# Patient Record
Sex: Female | Born: 1950 | Race: White | Hispanic: No | Smoking: Former smoker
Health system: Southern US, Community
[De-identification: ages and names within clinical notes are randomized; demographics above are authoritative.]

## PROBLEM LIST (undated history)

## (undated) DIAGNOSIS — G4730 Sleep apnea, unspecified: Secondary | ICD-10-CM

## (undated) DIAGNOSIS — K635 Polyp of colon: Secondary | ICD-10-CM

## (undated) DIAGNOSIS — E785 Hyperlipidemia, unspecified: Secondary | ICD-10-CM

## (undated) DIAGNOSIS — H353132 Nonexudative age-related macular degeneration, bilateral, intermediate dry stage: Secondary | ICD-10-CM

## (undated) DIAGNOSIS — Z8719 Personal history of other diseases of the digestive system: Secondary | ICD-10-CM

## (undated) DIAGNOSIS — J45909 Unspecified asthma, uncomplicated: Secondary | ICD-10-CM

## (undated) DIAGNOSIS — K5792 Diverticulitis of intestine, part unspecified, without perforation or abscess without bleeding: Secondary | ICD-10-CM

## (undated) DIAGNOSIS — H524 Presbyopia: Secondary | ICD-10-CM

## (undated) DIAGNOSIS — R911 Solitary pulmonary nodule: Secondary | ICD-10-CM

## (undated) DIAGNOSIS — H43813 Vitreous degeneration, bilateral: Secondary | ICD-10-CM

## (undated) DIAGNOSIS — M1990 Unspecified osteoarthritis, unspecified site: Secondary | ICD-10-CM

## (undated) DIAGNOSIS — D490 Neoplasm of unspecified behavior of digestive system: Secondary | ICD-10-CM

## (undated) DIAGNOSIS — H52209 Unspecified astigmatism, unspecified eye: Secondary | ICD-10-CM

## (undated) DIAGNOSIS — D3131 Benign neoplasm of right choroid: Secondary | ICD-10-CM

## (undated) DIAGNOSIS — K649 Unspecified hemorrhoids: Secondary | ICD-10-CM

## (undated) DIAGNOSIS — Z87442 Personal history of urinary calculi: Secondary | ICD-10-CM

## (undated) DIAGNOSIS — H26492 Other secondary cataract, left eye: Secondary | ICD-10-CM

## (undated) DIAGNOSIS — R590 Localized enlarged lymph nodes: Secondary | ICD-10-CM

## (undated) DIAGNOSIS — C189 Malignant neoplasm of colon, unspecified: Secondary | ICD-10-CM

## (undated) DIAGNOSIS — Z961 Presence of intraocular lens: Secondary | ICD-10-CM

## (undated) HISTORY — DX: Polyp of colon: K63.5

## (undated) HISTORY — DX: Malignant neoplasm of colon, unspecified: C18.9

## (undated) HISTORY — DX: Unspecified osteoarthritis, unspecified site: M19.90

## (undated) HISTORY — PX: BLADDER SUSPENSION: SHX72

## (undated) HISTORY — PX: ABDOMINAL HYSTERECTOMY: SHX81

## (undated) HISTORY — PX: CATARACT EXTRACTION, BILATERAL: SHX1313

## (undated) HISTORY — DX: Presence of intraocular lens: Z96.1

## (undated) HISTORY — DX: Nonexudative age-related macular degeneration, bilateral, intermediate dry stage: H35.3132

## (undated) HISTORY — DX: Sleep apnea, unspecified: G47.30

## (undated) HISTORY — DX: Diverticulitis of intestine, part unspecified, without perforation or abscess without bleeding: K57.92

## (undated) HISTORY — DX: Unspecified asthma, uncomplicated: J45.909

## (undated) HISTORY — DX: Unspecified astigmatism, unspecified eye: H52.209

## (undated) HISTORY — DX: Benign neoplasm of right choroid: D31.31

## (undated) HISTORY — PX: TUBAL LIGATION: SHX77

## (undated) HISTORY — DX: Neoplasm of unspecified behavior of digestive system: D49.0

## (undated) HISTORY — PX: ESOPHAGOGASTRODUODENOSCOPY: SHX1529

## (undated) HISTORY — DX: Unspecified hemorrhoids: K64.9

## (undated) HISTORY — PX: CHOLECYSTECTOMY: SHX55

## (undated) HISTORY — DX: Presbyopia: H52.4

## (undated) HISTORY — DX: Vitreous degeneration, bilateral: H43.813

## (undated) HISTORY — DX: Other secondary cataract, left eye: H26.492

## (undated) HISTORY — DX: Hyperlipidemia, unspecified: E78.5

---

## 2013-10-15 ENCOUNTER — Other Ambulatory Visit: Payer: Self-pay | Admitting: Orthopedic Surgery

## 2013-10-15 DIAGNOSIS — M25562 Pain in left knee: Secondary | ICD-10-CM

## 2013-10-18 ENCOUNTER — Ambulatory Visit
Admission: RE | Admit: 2013-10-18 | Discharge: 2013-10-18 | Disposition: A | Discharge status: Home / Self Care | Payer: Commercial Managed Care - PPO | Source: Ambulatory Visit | Attending: Orthopedic Surgery | Admitting: Orthopedic Surgery

## 2013-10-18 DIAGNOSIS — M25562 Pain in left knee: Secondary | ICD-10-CM

## 2013-10-21 ENCOUNTER — Ambulatory Visit (HOSPITAL_BASED_OUTPATIENT_CLINIC_OR_DEPARTMENT_OTHER)
Admission: RE | Admit: 2013-10-21 | Discharge: 2013-10-21 | Disposition: A | Discharge status: Home / Self Care | Payer: Commercial Managed Care - PPO | Source: Ambulatory Visit | Attending: Orthopedic Surgery | Admitting: Orthopedic Surgery

## 2013-10-21 ENCOUNTER — Other Ambulatory Visit (HOSPITAL_BASED_OUTPATIENT_CLINIC_OR_DEPARTMENT_OTHER): Payer: Self-pay | Admitting: Orthopedic Surgery

## 2013-10-21 DIAGNOSIS — M5137 Other intervertebral disc degeneration, lumbosacral region: Secondary | ICD-10-CM | POA: Insufficient documentation

## 2013-10-21 DIAGNOSIS — M48061 Spinal stenosis, lumbar region without neurogenic claudication: Secondary | ICD-10-CM | POA: Insufficient documentation

## 2013-10-21 DIAGNOSIS — M545 Low back pain, unspecified: Secondary | ICD-10-CM | POA: Insufficient documentation

## 2013-10-21 DIAGNOSIS — M47817 Spondylosis without myelopathy or radiculopathy, lumbosacral region: Secondary | ICD-10-CM | POA: Insufficient documentation

## 2013-10-21 DIAGNOSIS — M79609 Pain in unspecified limb: Secondary | ICD-10-CM | POA: Insufficient documentation

## 2013-10-21 DIAGNOSIS — M51379 Other intervertebral disc degeneration, lumbosacral region without mention of lumbar back pain or lower extremity pain: Secondary | ICD-10-CM | POA: Insufficient documentation

## 2017-10-26 ENCOUNTER — Ambulatory Visit (INDEPENDENT_AMBULATORY_CARE_PROVIDER_SITE_OTHER): Payer: Medicare HMO | Admitting: Emergency Medicine

## 2017-10-26 ENCOUNTER — Encounter: Payer: Self-pay | Admitting: *Deleted

## 2017-10-26 ENCOUNTER — Ambulatory Visit (INDEPENDENT_AMBULATORY_CARE_PROVIDER_SITE_OTHER)
Admission: RE | Admit: 2017-10-26 | Discharge: 2017-10-26 | Disposition: A | Discharge status: Home / Self Care | Payer: Medicare HMO | Source: Ambulatory Visit | Attending: Emergency Medicine | Admitting: Emergency Medicine

## 2017-10-26 ENCOUNTER — Other Ambulatory Visit: Payer: Self-pay | Admitting: Emergency Medicine

## 2017-10-26 ENCOUNTER — Ambulatory Visit: Payer: Medicare HMO | Admitting: Emergency Medicine

## 2017-10-26 VITALS — BP 120/96 | HR 104 | Ht 62.0 in | Wt 241.0 lb

## 2017-10-26 DIAGNOSIS — G4733 Obstructive sleep apnea (adult) (pediatric): Secondary | ICD-10-CM

## 2017-10-26 DIAGNOSIS — R0600 Dyspnea, unspecified: Secondary | ICD-10-CM

## 2017-10-26 DIAGNOSIS — C189 Malignant neoplasm of colon, unspecified: Secondary | ICD-10-CM | POA: Diagnosis not present

## 2017-10-26 DIAGNOSIS — R918 Other nonspecific abnormal finding of lung field: Secondary | ICD-10-CM

## 2017-10-26 DIAGNOSIS — J45909 Unspecified asthma, uncomplicated: Secondary | ICD-10-CM | POA: Diagnosis not present

## 2017-10-26 DIAGNOSIS — J309 Allergic rhinitis, unspecified: Secondary | ICD-10-CM | POA: Insufficient documentation

## 2017-10-26 DIAGNOSIS — J301 Allergic rhinitis due to pollen: Secondary | ICD-10-CM

## 2017-10-26 DIAGNOSIS — J449 Chronic obstructive pulmonary disease, unspecified: Secondary | ICD-10-CM | POA: Insufficient documentation

## 2017-10-26 DIAGNOSIS — R911 Solitary pulmonary nodule: Secondary | ICD-10-CM

## 2017-10-26 LAB — PULMONARY FUNCTION TEST
DL/VA % PRED: 99 %
DL/VA: 4.67 ml/min/mmHg/L
DLCO COR % PRED: 76 %
DLCO UNC % PRED: 81 %
DLCO UNC: 18.76 ml/min/mmHg
DLCO cor: 17.63 ml/min/mmHg
FEF 25-75 POST: 1.73 L/s
FEF 25-75 PRE: 1.34 L/s
FEF2575-%CHANGE-POST: 29 %
FEF2575-%PRED-POST: 91 %
FEF2575-%Pred-Pre: 70 %
FEV1-%CHANGE-POST: 7 %
FEV1-%PRED-POST: 76 %
FEV1-%Pred-Pre: 71 %
FEV1-PRE: 1.57 L
FEV1-Post: 1.68 L
FEV1FVC-%Change-Post: 7 %
FEV1FVC-%PRED-PRE: 102 %
FEV6-%Change-Post: 0 %
FEV6-%Pred-Post: 72 %
FEV6-%Pred-Pre: 72 %
FEV6-Post: 2.01 L
FEV6-Pre: 2.03 L
FEV6FVC-%Pred-Post: 104 %
FEV6FVC-%Pred-Pre: 104 %
FVC-%Change-Post: 0 %
FVC-%PRED-PRE: 69 %
FVC-%Pred-Post: 68 %
FVC-POST: 2.01 L
FVC-PRE: 2.03 L
POST FEV1/FVC RATIO: 84 %
PRE FEV1/FVC RATIO: 78 %
PRE FEV6/FVC RATIO: 100 %
Post FEV6/FVC ratio: 100 %
RV % PRED: 93 %
RV: 1.97 L
TLC % pred: 84 %
TLC: 4.12 L

## 2017-10-26 NOTE — Assessment & Plan Note (Signed)
Stage IIIb (pT3, pN1, Mx), status post sigmoid colectomy 05/27/16. Status post adjuvant chemotherapy with oxaliplatin and Xeloda 10/24/16 to 01/29/17

## 2017-10-26 NOTE — Assessment & Plan Note (Signed)
Severity unclear.  It does appear to be worse when she has active allergies.  She is currently using Dulera but only as needed.  Certainly we can change her to albuterol to use as needed.  We will perform full pulmonary function testing to quantify her degree of obstruction, plan bronchodilator therapy accordingly.

## 2017-10-26 NOTE — Assessment & Plan Note (Signed)
With associated hypermetabolic mediastinal lymphadenopathy.  Given the lymph nodes which would potentially upstage the patient were this to be primary lung cancer, and her history of colon cancer, I believe that a tissue diagnosis would be important before potentially committing her to primary resection.   We discussed the potential modalities for tissue diagnosis including nodule biopsy (either by navigational bronchoscopy or transthoracic needle biopsy), lymph node biopsies via endobronchial ultrasound.  I recommended EBUS since she needs staging and nodal biopsies could give a unifying diagnosis.  If the nodes are negative on EBUS, and if we are unable to get tissue from the lingular nodule then it would be reasonable to pursue transthoracic needle biopsy to determine whether this is primary lung cancer versus metastatic colon cancer.  If it were primary lung cancer and nodes were negative then she would potentially be a candidate for primary resection.  We can then discuss this with Dr. Freda Munro.

## 2017-10-26 NOTE — Progress Notes (Signed)
Subjective:    Patient ID: Natasha Peters, female    DOB: 10/21/1951, 66 y.o.   MRN: [id number]  HPI 66 year old former smoker (40 pack years) with a history of sleep apnea on CPAP, asthma that was made 3-4 years ago. She has Dulera that she uses prn. Uses zyrtec prn as well.   She has adenocarcinoma of the sigmoid colon status post sigmoid colectomy 09/2016, adjuvant chemotherapy completed 01/2017, managed by Dr Rudean Hitt.  1 out of 13 nodes positive for involvement.  Also with history of CLL.  She was noted to have a peripheral lingular pulmonary nodule on CT chest 02/2017.  Enlarged and more solid on repeat CT 09/24/17 scan that I have reviewed from 09/07/17.  This prompted a PET scan that was done on 09/24/17 that shows no abdominal hypermetabolism but confirmed hyper metabolic 1.7 cm solid lingular pulmonary nodule, enlarged and hypermetabolic subcarinal and right lower paratracheal mediastinal lymphadenopathy.  She saw Dr. Freda Munro with thoracic surgery, and is referred now for planning tissue diagnosis.  Review of Systems  Constitutional: Negative for fever and unexpected weight change.  HENT: Negative for congestion, dental problem, ear pain, nosebleeds, postnasal drip, rhinorrhea, sinus pressure, sneezing, sore throat and trouble swallowing.   Eyes: Negative for redness and itching.  Respiratory: Negative for cough, chest tightness, shortness of breath and wheezing.   Cardiovascular: Negative for palpitations and leg swelling.  Gastrointestinal: Negative for nausea and vomiting.  Genitourinary: Negative for dysuria.  Musculoskeletal: Negative for joint swelling.  Skin: Negative for rash.  Neurological: Negative for headaches.  Hematological: Does not bruise/bleed easily.  Psychiatric/Behavioral: Negative for dysphoric mood. The patient is not nervous/anxious.     Past Medical History:  Diagnosis Date  . Arthritis   . Asthma   . Astigmatism with presbyopia   . Benign neoplasm of  right choroid   . Choroidal nevus, right   . Colon cancer ([place])   . Colon neoplasm   . Colon polyp   . Diverticulitis   . Hemorrhoids   . Hyperlipidemia   . 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 Posterior capsular opacification non visually significant of left eye   . Pseudophakia of both eyes   . PVD (posterior vitreous detachment), both eyes   . Sleep apnea      Family History  Problem Relation Age of Onset  . Arthritis Father   . Hypertension Father   . Macular degeneration Father   . Macular degeneration Sister   . Diabetes Sister   . Cataracts Sister   . Hypertension Brother   . Diabetes Brother   . Macular degeneration Maternal Aunt      Social History   Socioeconomic History  . Marital status: Married    Spouse name: Not on file  . Number of children: Not on file  . Years of education: Not on file  . Highest education level: Not on file  Social Needs  . Financial resource strain: Not on file  . Food insecurity - worry: Not on file  . Food insecurity - inability: Not on file  . Transportation needs - medical: Not on file  . Transportation needs - non-medical: Not on file  Occupational History  . Not on file  Tobacco Use  . Smoking status: Former Smoker    Packs/day: 1.00    Years: 40.00    Pack years: 40.00    Types: Cigarettes    Last attempt to quit: 11/14/2003  Years since quitting: 13.9  . Smokeless tobacco: Never Used  Substance and Sexual Activity  . Alcohol use: No    Frequency: Never  . Drug use: No  . Sexual activity: Not on file  Other Topics Concern  . Not on file  Social History Narrative  . Not on file     Allergies  Allergen Reactions  . Actonel [Risedronate Sodium] Swelling  . Drixoral Allergy Sinus [Dexbromphen-Pse-Apap Er] Swelling  . Nsaids Diarrhea     Outpatient Medications Prior to Visit  Medication Sig Dispense Refill  . Acetaminophen (TYLENOL ARTHRITIS PAIN PO) Take 1 tablet by  mouth as needed.    Marland Kitchen aspirin EC 81 MG tablet Take 81 mg by mouth daily.    . Calcium Carbonate-Vit D-Min (CALCIUM 1200 PO) Take 1 tablet by mouth daily.    . cetirizine (ZYRTEC) 10 MG tablet Take 10 mg by mouth daily.    . Cholecalciferol (VITAMIN D3) 1000 units CAPS Take 1 capsule by mouth daily.    . Flaxseed, Linseed, (FLAXSEED OIL) 1000 MG CAPS Take 1 capsule by mouth daily.    Javier Docker Oil 1000 MG CAPS Take 1 capsule by mouth daily.    . mometasone-formoterol (DULERA) 200-5 MCG/ACT AERO Inhale 2 puffs into the lungs 2 (two) times daily.    . Multiple Vitamin (MULTIVITAMIN WITH MINERALS) TABS tablet Take 1 tablet by mouth daily.    . Multiple Vitamins-Minerals (PRESERVISION AREDS 2 PO) Take 1 tablet by mouth daily.    . pravastatin (PRAVACHOL) 80 MG tablet Take 80 mg by mouth daily.    Marland Kitchen triamterene-hydrochlorothiazide (MAXZIDE) 75-50 MG tablet Take 1 tablet by mouth daily as needed (for swelling).    . hydrochlorothiazide (HYDRODIURIL) 25 MG tablet Take 25 mg by mouth daily.    . pravastatin (PRAVACHOL) 20 MG tablet Take 20 mg by mouth daily.     No facility-administered medications prior to visit.         Objective:   Physical Exam Vitals:   10/26/17 1125 10/26/17 1126  BP:  (!) 120/96  Pulse:  (!) 104  SpO2:  96%  Weight: 241 lb (109.3 kg)   Height: 5\' 2"  (1.575 m)    Gen: Pleasant, well-nourished, in no distress,  normal affect  ENT: No lesions,  mouth clear,  oropharynx clear, no postnasal drip  Neck: No JVD, no TMG, no carotid bruits  Lungs: No use of accessory muscles, no dullness to percussion, clear without rales or rhonchi  Cardiovascular: RRR, heart sounds normal, no murmur or gallops, no peripheral edema  Musculoskeletal: No deformities, no cyanosis or clubbing  Neuro: alert, non focal  Skin: Warm, no lesions or rashes     Assessment & Plan:  Pulmonary nodule, left With associated hypermetabolic mediastinal lymphadenopathy.  Given the lymph nodes which  would potentially upstage the patient were this to be primary lung cancer, and her history of colon cancer, I believe that a tissue diagnosis would be important before potentially committing her to primary resection.   We discussed the potential modalities for tissue diagnosis including nodule biopsy (either by navigational bronchoscopy or transthoracic needle biopsy), lymph node biopsies via endobronchial ultrasound.  I recommended EBUS since she needs staging and nodal biopsies could give a unifying diagnosis.  If the nodes are negative on EBUS, and if we are unable to get tissue from the lingular nodule then it would be reasonable to pursue transthoracic needle biopsy to determine whether this is primary lung cancer versus metastatic  colon cancer.  If it were primary lung cancer and nodes were negative then she would potentially be a candidate for primary resection.  We can then discuss this with Dr. Freda Munro.   Asthma Severity unclear.  It does appear to be worse when she has active allergies.  She is currently using Dulera but only as needed.  Certainly we can change her to albuterol to use as needed.  We will perform full pulmonary function testing to quantify her degree of obstruction, plan bronchodilator therapy accordingly.  Allergic rhinitis Currently uses Zyrtec as needed  Obstructive sleep apnea Good compliance with CPAP, good clinical benefit.  Baltazar Apo, MD, PhD 10/26/2017, 1:39 PM [facility] [phone] or if no answer [phone]

## 2017-10-26 NOTE — Assessment & Plan Note (Signed)
Good compliance with CPAP, good clinical benefit.

## 2017-10-26 NOTE — H&P (View-Only) (Signed)
Subjective:    Patient ID: Natasha Peters, female    DOB: 1951-03-14, 66 y.o.   MRN: [id number]  HPI 66 year old former smoker (40 pack years) with a history of sleep apnea on CPAP, asthma that was made 3-4 years ago. She has Dulera that she uses prn. Uses zyrtec prn as well.   She has adenocarcinoma of the sigmoid colon status post sigmoid colectomy 09/2016, adjuvant chemotherapy completed 01/2017, managed by Dr Rudean Hitt.  1 out of 13 nodes positive for involvement.  Also with history of CLL.  She was noted to have a peripheral lingular pulmonary nodule on CT chest 02/2017.  Enlarged and more solid on repeat CT 09/24/17 scan that I have reviewed from 09/07/17.  This prompted a PET scan that was done on 09/24/17 that shows no abdominal hypermetabolism but confirmed hyper metabolic 1.7 cm solid lingular pulmonary nodule, enlarged and hypermetabolic subcarinal and right lower paratracheal mediastinal lymphadenopathy.  She saw Dr. Freda Munro with thoracic surgery, and is referred now for planning tissue diagnosis.  Review of Systems  Constitutional: Negative for fever and unexpected weight change.  HENT: Negative for congestion, dental problem, ear pain, nosebleeds, postnasal drip, rhinorrhea, sinus pressure, sneezing, sore throat and trouble swallowing.   Eyes: Negative for redness and itching.  Respiratory: Negative for cough, chest tightness, shortness of breath and wheezing.   Cardiovascular: Negative for palpitations and leg swelling.  Gastrointestinal: Negative for nausea and vomiting.  Genitourinary: Negative for dysuria.  Musculoskeletal: Negative for joint swelling.  Skin: Negative for rash.  Neurological: Negative for headaches.  Hematological: Does not bruise/bleed easily.  Psychiatric/Behavioral: Negative for dysphoric mood. The patient is not nervous/anxious.     Past Medical History:  Diagnosis Date  . Arthritis   . Asthma   . Astigmatism with presbyopia   . Benign neoplasm of  right choroid   . Choroidal nevus, right   . Colon cancer ([place])   . Colon neoplasm   . Colon polyp   . Diverticulitis   . Hemorrhoids   . Hyperlipidemia   . 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 Posterior capsular opacification non visually significant of left eye   . Pseudophakia of both eyes   . PVD (posterior vitreous detachment), both eyes   . Sleep apnea      Family History  Problem Relation Age of Onset  . Arthritis Father   . Hypertension Father   . Macular degeneration Father   . Macular degeneration Sister   . Diabetes Sister   . Cataracts Sister   . Hypertension Brother   . Diabetes Brother   . Macular degeneration Maternal Aunt      Social History   Socioeconomic History  . Marital status: Married    Spouse name: Not on file  . Number of children: Not on file  . Years of education: Not on file  . Highest education level: Not on file  Social Needs  . Financial resource strain: Not on file  . Food insecurity - worry: Not on file  . Food insecurity - inability: Not on file  . Transportation needs - medical: Not on file  . Transportation needs - non-medical: Not on file  Occupational History  . Not on file  Tobacco Use  . Smoking status: Former Smoker    Packs/day: 1.00    Years: 40.00    Pack years: 40.00    Types: Cigarettes    Last attempt to quit: 11/14/2003  Years since quitting: 13.9  . Smokeless tobacco: Never Used  Substance and Sexual Activity  . Alcohol use: No    Frequency: Never  . Drug use: No  . Sexual activity: Not on file  Other Topics Concern  . Not on file  Social History Narrative  . Not on file     Allergies  Allergen Reactions  . Actonel [Risedronate Sodium] Swelling  . Drixoral Allergy Sinus [Dexbromphen-Pse-Apap Er] Swelling  . Nsaids Diarrhea     Outpatient Medications Prior to Visit  Medication Sig Dispense Refill  . Acetaminophen (TYLENOL ARTHRITIS PAIN PO) Take 1 tablet by  mouth as needed.    Marland Kitchen aspirin EC 81 MG tablet Take 81 mg by mouth daily.    . Calcium Carbonate-Vit D-Min (CALCIUM 1200 PO) Take 1 tablet by mouth daily.    . cetirizine (ZYRTEC) 10 MG tablet Take 10 mg by mouth daily.    . Cholecalciferol (VITAMIN D3) 1000 units CAPS Take 1 capsule by mouth daily.    . Flaxseed, Linseed, (FLAXSEED OIL) 1000 MG CAPS Take 1 capsule by mouth daily.    Javier Docker Oil 1000 MG CAPS Take 1 capsule by mouth daily.    . mometasone-formoterol (DULERA) 200-5 MCG/ACT AERO Inhale 2 puffs into the lungs 2 (two) times daily.    . Multiple Vitamin (MULTIVITAMIN WITH MINERALS) TABS tablet Take 1 tablet by mouth daily.    . Multiple Vitamins-Minerals (PRESERVISION AREDS 2 PO) Take 1 tablet by mouth daily.    . pravastatin (PRAVACHOL) 80 MG tablet Take 80 mg by mouth daily.    Marland Kitchen triamterene-hydrochlorothiazide (MAXZIDE) 75-50 MG tablet Take 1 tablet by mouth daily as needed (for swelling).    . hydrochlorothiazide (HYDRODIURIL) 25 MG tablet Take 25 mg by mouth daily.    . pravastatin (PRAVACHOL) 20 MG tablet Take 20 mg by mouth daily.     No facility-administered medications prior to visit.         Objective:   Physical Exam Vitals:   10/26/17 1125 10/26/17 1126  BP:  (!) 120/96  Pulse:  (!) 104  SpO2:  96%  Weight: 241 lb (109.3 kg)   Height: 5\' 2"  (1.575 m)    Gen: Pleasant, well-nourished, in no distress,  normal affect  ENT: No lesions,  mouth clear,  oropharynx clear, no postnasal drip  Neck: No JVD, no TMG, no carotid bruits  Lungs: No use of accessory muscles, no dullness to percussion, clear without rales or rhonchi  Cardiovascular: RRR, heart sounds normal, no murmur or gallops, no peripheral edema  Musculoskeletal: No deformities, no cyanosis or clubbing  Neuro: alert, non focal  Skin: Warm, no lesions or rashes     Assessment & Plan:  Pulmonary nodule, left With associated hypermetabolic mediastinal lymphadenopathy.  Given the lymph nodes which  would potentially upstage the patient were this to be primary lung cancer, and her history of colon cancer, I believe that a tissue diagnosis would be important before potentially committing her to primary resection.   We discussed the potential modalities for tissue diagnosis including nodule biopsy (either by navigational bronchoscopy or transthoracic needle biopsy), lymph node biopsies via endobronchial ultrasound.  I recommended EBUS since she needs staging and nodal biopsies could give a unifying diagnosis.  If the nodes are negative on EBUS, and if we are unable to get tissue from the lingular nodule then it would be reasonable to pursue transthoracic needle biopsy to determine whether this is primary lung cancer versus metastatic  colon cancer.  If it were primary lung cancer and nodes were negative then she would potentially be a candidate for primary resection.  We can then discuss this with Dr. Freda Munro.   Asthma Severity unclear.  It does appear to be worse when she has active allergies.  She is currently using Dulera but only as needed.  Certainly we can change her to albuterol to use as needed.  We will perform full pulmonary function testing to quantify her degree of obstruction, plan bronchodilator therapy accordingly.  Allergic rhinitis Currently uses Zyrtec as needed  Obstructive sleep apnea Good compliance with CPAP, good clinical benefit.  Baltazar Apo, MD, PhD 10/26/2017, 1:39 PM [facility] [phone] or if no answer [phone]

## 2017-10-26 NOTE — Assessment & Plan Note (Signed)
Currently uses Zyrtec as needed

## 2017-10-26 NOTE — Progress Notes (Signed)
PFT done today. 

## 2017-10-30 ENCOUNTER — Encounter (HOSPITAL_COMMUNITY): Payer: Self-pay | Admitting: *Deleted

## 2017-10-30 ENCOUNTER — Other Ambulatory Visit: Payer: Self-pay

## 2017-10-30 ENCOUNTER — Telehealth: Payer: Self-pay | Admitting: Emergency Medicine

## 2017-10-30 NOTE — Progress Notes (Signed)
Pt denies any acute cardiopulmonary issues. Pt denies being under the care of a cardiologist. Pt denies having a stress test, echo and cardiac cath. Pt denies having a chest x ray and EKG within the last year. Pt denies recent labs. Pt made aware to stop taking Aspirin, vitamins, fish oil, Krill oil, Flaxseed, Tumeric and herbal medications. Do not take any NSAIDs ie: Ibuprofen, Advil, Naproxen (Aleve), Motrin, BC and Goody Powder. Pt verbalized understanding of all pre-op instructions.

## 2017-10-30 NOTE — Telephone Encounter (Signed)
I have sent a message to [facility] making him aware of this. Orders will be placed. Nothing further was needed.

## 2017-10-30 NOTE — Telephone Encounter (Signed)
Spoke with pt. She is supposed to be having a ENB/EBUS tomorrow with RB. Pt states that she has not been contacted about her instructions and where to go and what time to be there for the procedure. I have contacted Gail in pre-admitting and left her a message to please contact the pt with this information. The pt is aware that I have done this and will await Gail's call. Nothing further was needed at this time.

## 2017-10-31 ENCOUNTER — Ambulatory Visit (HOSPITAL_COMMUNITY): Payer: Medicare HMO

## 2017-10-31 ENCOUNTER — Inpatient Hospital Stay (HOSPITAL_COMMUNITY): Payer: Medicare HMO

## 2017-10-31 ENCOUNTER — Encounter (HOSPITAL_COMMUNITY)
Admission: AD | Disposition: A | Discharge status: Home / Self Care | Payer: Self-pay | Source: Ambulatory Visit | Attending: Emergency Medicine

## 2017-10-31 ENCOUNTER — Ambulatory Visit (HOSPITAL_COMMUNITY): Payer: Medicare HMO | Admitting: Certified Registered Nurse Anesthetist

## 2017-10-31 ENCOUNTER — Encounter (HOSPITAL_COMMUNITY): Payer: Self-pay

## 2017-10-31 ENCOUNTER — Observation Stay (HOSPITAL_COMMUNITY)
Admission: AD | Admit: 2017-10-31 | Discharge: 2017-11-01 | Disposition: A | Discharge status: Home / Self Care | Payer: Medicare HMO | Source: Ambulatory Visit | Attending: Emergency Medicine | Admitting: Emergency Medicine

## 2017-10-31 DIAGNOSIS — Z85038 Personal history of other malignant neoplasm of large intestine: Secondary | ICD-10-CM | POA: Insufficient documentation

## 2017-10-31 DIAGNOSIS — Z6841 Body Mass Index (BMI) 40.0 and over, adult: Secondary | ICD-10-CM | POA: Diagnosis not present

## 2017-10-31 DIAGNOSIS — Z79899 Other long term (current) drug therapy: Secondary | ICD-10-CM | POA: Insufficient documentation

## 2017-10-31 DIAGNOSIS — R911 Solitary pulmonary nodule: Secondary | ICD-10-CM | POA: Diagnosis present

## 2017-10-31 DIAGNOSIS — R042 Hemoptysis: Secondary | ICD-10-CM | POA: Insufficient documentation

## 2017-10-31 DIAGNOSIS — Z886 Allergy status to analgesic agent status: Secondary | ICD-10-CM | POA: Insufficient documentation

## 2017-10-31 DIAGNOSIS — Z888 Allergy status to other drugs, medicaments and biological substances status: Secondary | ICD-10-CM | POA: Diagnosis not present

## 2017-10-31 DIAGNOSIS — Z923 Personal history of irradiation: Secondary | ICD-10-CM | POA: Insufficient documentation

## 2017-10-31 DIAGNOSIS — Z87891 Personal history of nicotine dependence: Secondary | ICD-10-CM | POA: Diagnosis not present

## 2017-10-31 DIAGNOSIS — G4733 Obstructive sleep apnea (adult) (pediatric): Secondary | ICD-10-CM | POA: Insufficient documentation

## 2017-10-31 DIAGNOSIS — R079 Chest pain, unspecified: Secondary | ICD-10-CM

## 2017-10-31 DIAGNOSIS — Z9989 Dependence on other enabling machines and devices: Secondary | ICD-10-CM | POA: Insufficient documentation

## 2017-10-31 DIAGNOSIS — M1990 Unspecified osteoarthritis, unspecified site: Secondary | ICD-10-CM | POA: Insufficient documentation

## 2017-10-31 DIAGNOSIS — J449 Chronic obstructive pulmonary disease, unspecified: Secondary | ICD-10-CM | POA: Insufficient documentation

## 2017-10-31 DIAGNOSIS — C969 Malignant neoplasm of lymphoid, hematopoietic and related tissue, unspecified: Secondary | ICD-10-CM | POA: Diagnosis not present

## 2017-10-31 DIAGNOSIS — J9600 Acute respiratory failure, unspecified whether with hypoxia or hypercapnia: Secondary | ICD-10-CM | POA: Diagnosis present

## 2017-10-31 DIAGNOSIS — R591 Generalized enlarged lymph nodes: Secondary | ICD-10-CM | POA: Diagnosis present

## 2017-10-31 DIAGNOSIS — Z9889 Other specified postprocedural states: Secondary | ICD-10-CM

## 2017-10-31 DIAGNOSIS — Z856 Personal history of leukemia: Secondary | ICD-10-CM | POA: Insufficient documentation

## 2017-10-31 DIAGNOSIS — J309 Allergic rhinitis, unspecified: Secondary | ICD-10-CM | POA: Insufficient documentation

## 2017-10-31 DIAGNOSIS — Z9221 Personal history of antineoplastic chemotherapy: Secondary | ICD-10-CM | POA: Diagnosis not present

## 2017-10-31 DIAGNOSIS — R590 Localized enlarged lymph nodes: Secondary | ICD-10-CM | POA: Diagnosis not present

## 2017-10-31 DIAGNOSIS — C189 Malignant neoplasm of colon, unspecified: Secondary | ICD-10-CM | POA: Diagnosis present

## 2017-10-31 DIAGNOSIS — Z419 Encounter for procedure for purposes other than remedying health state, unspecified: Secondary | ICD-10-CM

## 2017-10-31 DIAGNOSIS — G8918 Other acute postprocedural pain: Secondary | ICD-10-CM | POA: Insufficient documentation

## 2017-10-31 DIAGNOSIS — Z7982 Long term (current) use of aspirin: Secondary | ICD-10-CM | POA: Insufficient documentation

## 2017-10-31 HISTORY — DX: Personal history of other diseases of the digestive system: Z87.19

## 2017-10-31 HISTORY — DX: Localized enlarged lymph nodes: R59.0

## 2017-10-31 HISTORY — DX: Personal history of urinary calculi: Z87.442

## 2017-10-31 HISTORY — PX: VIDEO BRONCHOSCOPY WITH ENDOBRONCHIAL ULTRASOUND: SHX6177

## 2017-10-31 HISTORY — DX: Solitary pulmonary nodule: R91.1

## 2017-10-31 HISTORY — PX: VIDEO BRONCHOSCOPY WITH ENDOBRONCHIAL NAVIGATION: SHX6175

## 2017-10-31 LAB — CBC
HEMATOCRIT: 44.5 % (ref 36.0–46.0)
HEMOGLOBIN: 14.5 g/dL (ref 12.0–15.0)
MCH: 29.5 pg (ref 26.0–34.0)
MCHC: 32.6 g/dL (ref 30.0–36.0)
MCV: 90.6 fL (ref 78.0–100.0)
Platelets: 204 10*3/uL (ref 150–400)
RBC: 4.91 MIL/uL (ref 3.87–5.11)
RDW: 14.5 % (ref 11.5–15.5)
WBC: 10.3 10*3/uL (ref 4.0–10.5)

## 2017-10-31 LAB — COMPREHENSIVE METABOLIC PANEL
ALBUMIN: 3.6 g/dL (ref 3.5–5.0)
ALBUMIN: 3.7 g/dL (ref 3.5–5.0)
ALT: 27 U/L (ref 14–54)
ALT: 29 U/L (ref 14–54)
ANION GAP: 9 (ref 5–15)
ANION GAP: 7 (ref 5–15)
AST: 26 U/L (ref 15–41)
AST: 31 U/L (ref 15–41)
Alkaline Phosphatase: 93 U/L (ref 38–126)
Alkaline Phosphatase: 84 U/L (ref 38–126)
BILIRUBIN TOTAL: 0.6 mg/dL (ref 0.3–1.2)
BUN: 17 mg/dL (ref 6–20)
BUN: 17 mg/dL (ref 6–20)
CHLORIDE: 108 mmol/L (ref 101–111)
CHLORIDE: 104 mmol/L (ref 101–111)
CO2: 23 mmol/L (ref 22–32)
CO2: 24 mmol/L (ref 22–32)
Calcium: 9.8 mg/dL (ref 8.9–10.3)
Calcium: 9.5 mg/dL (ref 8.9–10.3)
Creatinine, Ser: 0.9 mg/dL (ref 0.44–1.00)
Creatinine, Ser: 1 mg/dL (ref 0.44–1.00)
GFR calc Af Amer: >60 mL/min (ref >60)
GFR calc Af Amer: >60 mL/min (ref >60)
GFR calc non Af Amer: 57 mL/min — ABNORMAL LOW (ref >60)
GLUCOSE: 117 mg/dL — AB (ref 65–99)
Glucose, Bld: 99 mg/dL (ref 65–99)
POTASSIUM: 3.8 mmol/L (ref 3.5–5.1)
POTASSIUM: 4.4 mmol/L (ref 3.5–5.1)
Sodium: 140 mmol/L (ref 135–145)
Sodium: 135 mmol/L (ref 135–145)
TOTAL PROTEIN: 6.2 g/dL — AB (ref 6.5–8.1)
Total Bilirubin: 0.4 mg/dL (ref 0.3–1.2)
Total Protein: 6.1 g/dL — ABNORMAL LOW (ref 6.5–8.1)

## 2017-10-31 LAB — TROPONIN I

## 2017-10-31 LAB — APTT: APTT: 27 s (ref 24–36)

## 2017-10-31 LAB — PROTIME-INR
INR: 0.99
Prothrombin Time: 12.9 seconds (ref 11.4–15.2)

## 2017-10-31 LAB — BRAIN NATRIURETIC PEPTIDE: B NATRIURETIC PEPTIDE 5: 289.9 pg/mL — AB (ref 0.0–100.0)

## 2017-10-31 SURGERY — VIDEO BRONCHOSCOPY WITH ENDOBRONCHIAL NAVIGATION
Anesthesia: General | Site: Chest

## 2017-10-31 MED ORDER — ONDANSETRON HCL 4 MG/2ML IJ SOLN: 4.0000 mg | Freq: Four times a day (QID) | INTRAMUSCULAR | Status: DC | PRN

## 2017-10-31 MED ORDER — ASPIRIN 81 MG PO CHEW: 324.0000 mg | CHEWABLE_TABLET | ORAL | Status: DC

## 2017-10-31 MED ORDER — 0.9 % SODIUM CHLORIDE (POUR BTL) OPTIME
TOPICAL | Status: DC | PRN
  Administered 2017-10-31: 1000 mL

## 2017-10-31 MED ORDER — FENTANYL CITRATE (PF) 250 MCG/5ML IJ SOLN
INTRAMUSCULAR | Status: AC
  Filled 2017-10-31: qty 5

## 2017-10-31 MED ORDER — EPINEPHRINE PF 1 MG/ML IJ SOLN
INTRAMUSCULAR | Status: AC
  Filled 2017-10-31: qty 1

## 2017-10-31 MED ORDER — HYPROMELLOSE (GONIOSCOPIC) 2.5 % OP SOLN
2.0000 [drp] | Freq: Four times a day (QID) | OPHTHALMIC | Status: DC | PRN
  Filled 2017-10-31: qty 15

## 2017-10-31 MED ORDER — ROCURONIUM BROMIDE 100 MG/10ML IV SOLN
INTRAVENOUS | Status: DC | PRN
  Administered 2017-10-31: 20 mg via INTRAVENOUS
  Administered 2017-10-31: 50 mg via INTRAVENOUS

## 2017-10-31 MED ORDER — OXYCODONE HCL 5 MG/5ML PO SOLN
5.0000 mg | Freq: Once | ORAL | Status: DC | PRN
Start: 2017-10-31 — End: 2017-10-31

## 2017-10-31 MED ORDER — MIDAZOLAM HCL 2 MG/2ML IJ SOLN
INTRAMUSCULAR | Status: AC
  Filled 2017-10-31: qty 2

## 2017-10-31 MED ORDER — ALBUTEROL SULFATE (2.5 MG/3ML) 0.083% IN NEBU
INHALATION_SOLUTION | RESPIRATORY_TRACT | Status: AC
  Administered 2017-10-31: 2.5 mg via RESPIRATORY_TRACT
  Filled 2017-10-31: qty 3

## 2017-10-31 MED ORDER — LACTATED RINGERS IV SOLN
INTRAVENOUS | Status: DC
  Administered 2017-10-31 (×3): via INTRAVENOUS

## 2017-10-31 MED ORDER — MIDAZOLAM HCL 5 MG/5ML IJ SOLN
INTRAMUSCULAR | Status: DC | PRN
  Administered 2017-10-31: 2 mg via INTRAVENOUS

## 2017-10-31 MED ORDER — ALBUTEROL SULFATE (2.5 MG/3ML) 0.083% IN NEBU: 2.5000 mg | INHALATION_SOLUTION | RESPIRATORY_TRACT | Status: DC | PRN

## 2017-10-31 MED ORDER — LACTATED RINGERS IV SOLN: INTRAVENOUS | Status: DC

## 2017-10-31 MED ORDER — LIDOCAINE HCL (CARDIAC) 20 MG/ML IV SOLN
INTRAVENOUS | Status: DC | PRN
  Administered 2017-10-31: 100 mg via INTRAVENOUS

## 2017-10-31 MED ORDER — FENTANYL CITRATE (PF) 100 MCG/2ML IJ SOLN
INTRAMUSCULAR | Status: AC
  Administered 2017-10-31: 25 ug via INTRAVENOUS
  Filled 2017-10-31: qty 2

## 2017-10-31 MED ORDER — FENTANYL CITRATE (PF) 100 MCG/2ML IJ SOLN
INTRAMUSCULAR | Status: DC | PRN
  Administered 2017-10-31: 50 ug via INTRAVENOUS
  Administered 2017-10-31: 150 ug via INTRAVENOUS
  Administered 2017-10-31: 50 ug via INTRAVENOUS

## 2017-10-31 MED ORDER — ALBUTEROL SULFATE (2.5 MG/3ML) 0.083% IN NEBU
2.5000 mg | INHALATION_SOLUTION | Freq: Once | RESPIRATORY_TRACT | Status: AC
  Administered 2017-10-31: 2.5 mg via RESPIRATORY_TRACT

## 2017-10-31 MED ORDER — DEXAMETHASONE SODIUM PHOSPHATE 4 MG/ML IJ SOLN
INTRAMUSCULAR | Status: DC | PRN
  Administered 2017-10-31: 10 mg via INTRAVENOUS

## 2017-10-31 MED ORDER — ONDANSETRON HCL 4 MG/2ML IJ SOLN
INTRAMUSCULAR | Status: DC | PRN
  Administered 2017-10-31: 4 mg via INTRAVENOUS

## 2017-10-31 MED ORDER — SODIUM CHLORIDE 0.9 % IV SOLN: 250.0000 mL | INTRAVENOUS | Status: DC | PRN

## 2017-10-31 MED ORDER — FENTANYL CITRATE (PF) 100 MCG/2ML IJ SOLN
25.0000 ug | INTRAMUSCULAR | Status: DC | PRN
  Administered 2017-10-31 (×3): 25 ug via INTRAVENOUS

## 2017-10-31 MED ORDER — ASPIRIN 300 MG RE SUPP: 300.0000 mg | RECTAL | Status: DC

## 2017-10-31 MED ORDER — ACETAMINOPHEN 325 MG PO TABS: 650.0000 mg | ORAL_TABLET | ORAL | Status: DC | PRN

## 2017-10-31 MED ORDER — OXYCODONE HCL 5 MG PO TABS: 5.0000 mg | ORAL_TABLET | Freq: Once | ORAL | Status: DC | PRN

## 2017-10-31 MED ORDER — SUGAMMADEX SODIUM 200 MG/2ML IV SOLN
INTRAVENOUS | Status: DC | PRN
  Administered 2017-10-31: 500 mg via INTRAVENOUS

## 2017-10-31 MED ORDER — PROPOFOL 10 MG/ML IV BOLUS
INTRAVENOUS | Status: DC | PRN
  Administered 2017-10-31: 180 mg via INTRAVENOUS

## 2017-10-31 MED ORDER — PROPOFOL 10 MG/ML IV BOLUS
INTRAVENOUS | Status: AC
  Filled 2017-10-31: qty 20

## 2017-10-31 SURGICAL SUPPLY — 43 items
ADAPTER BRONCH F/PENTAX (ADAPTER) ×3 IMPLANT
BAG SNAP BAND KOVER 36X36 (MISCELLANEOUS) ×3 IMPLANT
BRUSH CYTOL CELLEBRITY 1.5X140 (MISCELLANEOUS) ×3 IMPLANT
BRUSH SUPERTRAX BIOPSY (INSTRUMENTS) IMPLANT
BRUSH SUPERTRAX NDL-TIP CYTO (INSTRUMENTS) ×3 IMPLANT
CANISTER SUCT 3000ML PPV (MISCELLANEOUS) ×3 IMPLANT
CHANNEL WORK EXTEND EDGE 180 (KITS) IMPLANT
CHANNEL WORK EXTEND EDGE 45 (KITS) IMPLANT
CHANNEL WORK EXTEND EDGE 90 (KITS) IMPLANT
CONT SPEC 4OZ CLIKSEAL STRL BL (MISCELLANEOUS) ×3 IMPLANT
COVER BACK TABLE 60X90IN (DRAPES) ×3 IMPLANT
COVER DOME SNAP 22 D (MISCELLANEOUS) ×3 IMPLANT
FILTER STRAW FLUID ASPIR (MISCELLANEOUS) IMPLANT
FORCEPS BIOP RJ4 1.8 (CUTTING FORCEPS) IMPLANT
FORCEPS BIOP SUPERTRX PREMAR (INSTRUMENTS) ×3 IMPLANT
GAUZE SPONGE 4X4 12PLY STRL (GAUZE/BANDAGES/DRESSINGS) ×3 IMPLANT
GLOVE BIO SURGEON STRL SZ7.5 (GLOVE) ×6 IMPLANT
GOWN STRL REUS W/ TWL LRG LVL3 (GOWN DISPOSABLE) ×2 IMPLANT
GOWN STRL REUS W/TWL LRG LVL3 (GOWN DISPOSABLE) ×4
KIT CLEAN ENDO COMPLIANCE (KITS) ×6 IMPLANT
KIT LOCATABLE GUIDE (CANNULA) IMPLANT
KIT MARKER FIDUCIAL DELIVERY (KITS) IMPLANT
KIT PROCEDURE EDGE 180 (KITS) ×3 IMPLANT
KIT PROCEDURE EDGE 45 (KITS) IMPLANT
KIT PROCEDURE EDGE 90 (KITS) IMPLANT
KIT ROOM TURNOVER OR (KITS) ×3 IMPLANT
MARKER SKIN DUAL TIP RULER LAB (MISCELLANEOUS) ×3 IMPLANT
NEEDLE EBUS SONO TIP PENTAX (NEEDLE) ×3 IMPLANT
NEEDLE SUPERTRX PREMARK BIOPSY (NEEDLE) ×3 IMPLANT
NS IRRIG 1000ML POUR BTL (IV SOLUTION) ×3 IMPLANT
OIL SILICONE PENTAX (PARTS (SERVICE/REPAIRS)) ×3 IMPLANT
PAD ARMBOARD 7.5X6 YLW CONV (MISCELLANEOUS) ×6 IMPLANT
PATCHES PATIENT (LABEL) ×9 IMPLANT
SYR 20CC LL (SYRINGE) ×6 IMPLANT
SYR 20ML ECCENTRIC (SYRINGE) ×6 IMPLANT
SYR 50ML SLIP (SYRINGE) ×3 IMPLANT
SYR 5ML LUER SLIP (SYRINGE) ×3 IMPLANT
TOWEL OR 17X24 6PK STRL BLUE (TOWEL DISPOSABLE) ×3 IMPLANT
TRAP SPECIMEN MUCOUS 40CC (MISCELLANEOUS) IMPLANT
TUBE CONNECTING 20'X1/4 (TUBING) ×2
TUBE CONNECTING 20X1/4 (TUBING) ×4 IMPLANT
UNDERPAD 30X30 (UNDERPADS AND DIAPERS) ×3 IMPLANT
WATER STERILE IRR 1000ML POUR (IV SOLUTION) ×3 IMPLANT

## 2017-10-31 NOTE — Anesthesia Procedure Notes (Signed)
Procedure Name: Intubation Date/Time: 10/31/2017 10:05 AM Performed by: Calton Harshfield T, CRNA Pre-anesthesia Checklist: Patient identified, Emergency Drugs available, Suction available and Patient being monitored Patient Re-evaluated:Patient Re-evaluated prior to induction Oxygen Delivery Method: Circle system utilized Preoxygenation: Pre-oxygenation with 100% oxygen Induction Type: IV induction Ventilation: Mask ventilation without difficulty and Oral airway inserted - appropriate to patient size Laryngoscope Size: Mac and 4 Grade View: Grade I Tube type: Oral Tube size: 9.0 mm Number of attempts: 1 Airway Equipment and Method: Patient positioned with wedge pillow and Stylet Placement Confirmation: ETT inserted through vocal cords under direct vision,  positive ETCO2 and breath sounds checked- equal and bilateral Secured at: 22 cm Tube secured with: Tape Dental Injury: Teeth and Oropharynx as per pre-operative assessment

## 2017-10-31 NOTE — Anesthesia Preprocedure Evaluation (Addendum)
Anesthesia Evaluation  Patient identified by MRN, date of birth, ID band Patient awake    Reviewed: Allergy & Precautions, H&P , NPO status , Patient's Chart, lab work & pertinent test results  Airway Mallampati: II   Neck ROM: full    Dental  (+) Edentulous Upper, Dental Advisory Given   Pulmonary asthma , sleep apnea , former smoker,    breath sounds clear to auscultation       Cardiovascular negative cardio ROS   Rhythm:regular Rate:Normal     Neuro/Psych    GI/Hepatic   Endo/Other  Morbid obesity  Renal/GU      Musculoskeletal  (+) Arthritis ,   Abdominal   Peds  Hematology   Anesthesia Other Findings   Reproductive/Obstetrics                            Anesthesia Physical Anesthesia Plan  ASA: III  Anesthesia Plan: General   Post-op Pain Management:    Induction: Intravenous  PONV Risk Score and Plan: 3 and Ondansetron, Dexamethasone and Treatment may vary due to age or medical condition  Airway Management Planned: Oral ETT  Additional Equipment:   Intra-op Plan:   Post-operative Plan: Extubation in OR  Informed Consent: I have reviewed the patients History and Physical, chart, labs and discussed the procedure including the risks, benefits and alternatives for the proposed anesthesia with the patient or authorized representative who has indicated his/her understanding and acceptance.     Plan Discussed with: CRNA, Anesthesiologist and Surgeon  Anesthesia Plan Comments:         Anesthesia Quick Evaluation

## 2017-10-31 NOTE — Interval H&P Note (Signed)
PCCM Interval  note  S: Patient presents today for further evaluation of her peripheral lingular nodule and hypermetabolic mediastinal lymphadenopathy.  This in the setting of history of adenocarcinoma of the sigmoid colon.  She reports no new problems, no dyspnea, no cough.  She is having some acute on chronic right shoulder pain that appears to be musculoskeletal. We reviewed her pulmonary function testing together.  This shows moderate mixed obstruction and restriction, FEV1 1.57 L (71% predicted).  Vitals:   10/31/17 0724 10/31/17 0726  BP: 136/86 (!) 129/96  Pulse: 76   Resp: 19   Temp: 97.8 F (36.6 C)   SpO2: 96%    Gen: Pleasant, obese woman, in no distress,  normal affect  ENT: No lesions,  mouth clear,  oropharynx clear, no postnasal drip, dentures in place, M1 airway  Neck: No JVD, no stridor  Lungs: No use of accessory muscles, distant but clear bilaterally  Cardiovascular: RRR, heart sounds normal, no murmur or gallops, no peripheral edema  Abdomen: soft and NT, no HSM,  BS normal  Musculoskeletal: No deformities, no cyanosis or clubbing  Neuro: alert, non focal  Skin: Warm, no lesions or rashes   Impression/plan: We will plan for endobronchial ultrasound to characterize and sample her mediastinal lymphadenopathy.  If no positive pathology then we will proceed to navigational biopsies of her distal lingular nodule if it can be accessed.  If we are unable to reach the lingular nodule and there is no positive cytology on her nodes then it would be reasonable for [place] to arrange for a transthoracic needle biopsy in the lingula.  Patient understands the risks and benefits of the procedure, aldolase to proceed.  All questions answered.  Baltazar Apo, MD, PhD 10/31/2017, 8:50 AM [facility] [phone] or if no answer [phone]

## 2017-10-31 NOTE — Anesthesia Postprocedure Evaluation (Signed)
Anesthesia Post Note  Patient: Natasha Peters  Procedure(s) Performed: VIDEO BRONCHOSCOPY WITH ENDOBRONCHIAL NAVIGATION (N/A Chest) VIDEO BRONCHOSCOPY WITH ENDOBRONCHIAL ULTRASOUND (N/A Chest)     Patient location during evaluation: PACU Anesthesia Type: General Level of consciousness: awake and alert Pain management: pain level controlled Vital Signs Assessment: post-procedure vital signs reviewed and stable Respiratory status: spontaneous breathing, nonlabored ventilation, respiratory function stable and patient connected to nasal cannula oxygen Cardiovascular status: blood pressure returned to baseline and stable Postop Assessment: no apparent nausea or vomiting Anesthetic complications: no    Last Vitals:  Vitals:   10/31/17 1300 10/31/17 1315  BP: 129/64 127/76  Pulse: 82 79  Resp: 15 12  Temp:    SpO2: 95% 94%    Last Pain:  Vitals:   10/31/17 1300  TempSrc:   PainSc: 0-No pain                 Bradleigh Sonnen S

## 2017-10-31 NOTE — Op Note (Signed)
Video Bronchoscopy with Endobronchial Ultrasound and electromagnetic navigation procedure Note  Date of Operation: 10/31/2017  Pre-op Diagnosis: Mediastinal lymphadenopathy, lingular lung nodule  Post-op Diagnosis: Same, suspected carcinoma  Surgeon: Baltazar Apo  Assistants: None  Anesthesia: General endotracheal anesthesia  Operation: Flexible video fiberoptic bronchoscopy with endobronchial ultrasound and biopsies.  Estimated Blood Loss: Minimal  Complications: None apparent  Indications and History: Natasha Peters is a 66 y.o. female with history of tobacco use, also history of cecal colon cancer status post resection, chemoradiation.  Part of her cancer follow-up included CT scans of the abdomen and chest which revealed mediastinal lymphadenopathy and a very peripheral lingular nodule.  Subsequent PET scan showed that these areas were hypermetabolic.  Recommendation was made to achieve tissue diagnosis via endobronchial ultrasound with nodal biopsies, navigational bronchoscopy with nodular biopsies.  The risks, benefits, complications, treatment options and expected outcomes were discussed with the patient.  The possibilities of pneumothorax, pneumonia, reaction to medication, pulmonary aspiration, perforation of a viscus, bleeding, failure to diagnose a condition and creating a complication requiring transfusion or operation were discussed with the patient who freely signed the consent.    Description of Procedure: The patient was examined in the preoperative area and history and data from the preprocedure consultation were reviewed. It was deemed appropriate to proceed.  The patient was taken to OR 10, identified as Natasha Peters and the procedure verified as Flexible Video Fiberoptic Bronchoscopy.  A Time Out was held and the above information confirmed. After being taken to the operating room general anesthesia was initiated and the patient  was orally intubated. The video fiberoptic  bronchoscope was introduced via the endotracheal tube and a general inspection was performed which showed normal airways throughout.  There were no endobronchial lesions or abnormal secretions seen.   The standard scope was then withdrawn and the endobronchial ultrasound was used to identify and characterize the peritracheal, hilar and bronchial lymph nodes. Inspection showed no significant enlargement at 4L.  The 4R and 7 lymph nodes were significantly enlarged. Using real-time ultrasound guidance Wang needle biopsies were take from Station 4R and 7 nodes and were sent for cytology.   Prior to the date of the procedure a high-resolution CT scan of the chest was performed. Utilizing [place] a virtual tracheobronchial tree was generated to allow the creation of distinct navigation pathways to the patient's lingular nodule.  The standard scope was reintroduced and the extendable working channel and locator guide were introduced into the bronchoscope. The distinct navigation pathways prepared prior to this procedure were then utilized to navigate to within 1.0 cm of patient's lesion identified on CT scan. The extendable working channel was secured into place and the locator guide was withdrawn. Under fluoroscopic guidance transbronchial forceps biopsies were performed to be sent for pathology. At the end of the procedure a general airway inspection was performed and there was no evidence of active bleeding. The bronchoscope was removed.  The patient tolerated the procedure well. There was no significant blood loss and there were no obvious complications. A post-procedural chest x-ray is pending.   Samples: 1. Wang needle biopsies from 4R node 2. Wang needle biopsies from 7 node 3. Transbronchial forceps biopsies from lingular nodule  Plans:  The patient will be discharged from the PACU to home when recovered from anesthesia. We will review the cytology, pathology and microbiology results  with the patient when they become available. Outpatient followup will be with Dr Lamonte Sakai and Drs. Durene Romans at [facility].  Baltazar Apo, MD, PhD 10/31/2017, 11:56 AM [facility] [phone] or if no answer [phone]

## 2017-10-31 NOTE — H&P (Signed)
PULMONARY / CRITICAL CARE MEDICINE   Name: Natasha Peters MRN: [id number] DOB: 1951/02/22    ADMISSION DATE:  10/31/2017 CONSULTATION DATE: 12/19  CHIEF COMPLAINT: Chest pain and shortness of breath  HISTORY OF PRESENT ILLNESS:   Is a 66 year old white female with a 40-pack-year history of smoking also history of sleep apnea on CPAP at night.  Also carries a history of asthma for which she takes [facility] on a as needed basis.  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  Also has a history of CLL.  She was noted to have a peripheral lung nodule back in April 2018 repeat CT imaging in November 2018 demonstrated an enlargement of this nodule.  She therefore underwent a PET scan which abdomen was negative but did have hypermetabolic region of the pulmonary nodule.  Also enlarged hypermetabolic subcarinal and paratracheal mediastinal lymphadenopathy.  She was referred for tissue biopsy.  She underwent video bronchoscopy with endobronchial ultrasound and electromagnetic navigation on 10/31/2017.  Biopsies were obtained.  Postoperatively in the operating room the patient complained of significant chest discomfort, hemoptysis, and chest pain and emergent chest x-ray was obtained given concern for possible postprocedural pneumothorax this was negative, however her underlying pulmonary status remained for concern given concomitant hemoptysis and dyspnea we therefore will admit her for observation  PAST MEDICAL HISTORY :  She  has a past medical history of Arthritis, Asthma, Astigmatism with presbyopia, Benign neoplasm of right choroid, Choroidal nevus, right, Colon cancer ([place]), Colon neoplasm, Colon polyp, Diverticulitis, Hemorrhoids, History of kidney stones, gallstones, Hyperlipidemia, Intermediate stage dry 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  PAST SURGICAL HISTORY: She  has a past surgical history that includes Bladder suspension; Cataract extraction, bilateral; Cholecystectomy; Abdominal hysterectomy; Tubal ligation; and Esophagogastroduodenoscopy.  Allergies  Allergen Reactions  . Actonel [Risedronate Sodium] Swelling  . Drixoral Allergy Sinus [Dexbromphen-Pse-Apap Er] Swelling  . Nsaids Diarrhea    Bloody stool  . Other     No seeds, nuts, or husk due to diverticulitis    No current facility-administered medications on file prior to encounter.    Current Outpatient Medications on File Prior to Encounter  Medication Sig  . aspirin EC 81 MG tablet Take 81 mg by mouth daily.  . Calcium Carb-Cholecalciferol (CALCIUM 600 + D PO) Take 1 tablet by mouth daily.  . Carboxymethylcellul-Glycerin (LUBRICATING EYE DROPS OP) Apply 1 drop to eye daily as needed (dry eyes).  . cetirizine (ZYRTEC) 10 MG tablet Take 10 mg by mouth daily as needed for allergies.   . Cholecalciferol (VITAMIN D3) 1000 units CAPS Take 1,000 Units by mouth daily.   . Emollient (UDDERLY SMOOTH) CREA Apply 1 application topically daily as needed (dry skin).  . Flaxseed, Linseed, (FLAXSEED OIL) 1000 MG CAPS Take 1,000 mg by mouth daily.   Marland Kitchen KRILL OIL PO Take 1 capsule by mouth daily.  . mometasone-formoterol (DULERA) 200-5 MCG/ACT AERO Inhale 2 puffs into the lungs 2 (two) times daily as needed for wheezing or shortness of breath.   . Multiple Vitamin (MULTIVITAMIN WITH MINERALS) TABS tablet Take 1 tablet by mouth daily.  . Multiple Vitamins-Minerals (PRESERVISION AREDS 2 PO) Take 1 tablet by mouth 2 (two) times daily.   . pravastatin (PRAVACHOL)  80 MG tablet Take 80 mg by mouth daily.  Marland Kitchen triamterene-hydrochlorothiazide (MAXZIDE) 75-50 MG tablet Take 0.5 tablets by mouth daily as needed (for  swelling).   . TURMERIC PO Take 1 capsule by mouth daily.    FAMILY HISTORY:  Her indicated that her mother is deceased. She indicated that her father is deceased. She indicated that the status of her sister is unknown. She indicated that the status of her brother is unknown. She indicated that the status of her maternal aunt is unknown.   SOCIAL HISTORY: She  reports that she quit smoking about 13 years ago. Her smoking use included cigarettes. She has a 40.00 pack-year smoking history. she has never used smokeless tobacco. She reports that she does not drink alcohol or use drugs.  REVIEW OF SYSTEMS:   Unable to obtain due to postoperative state  SUBJECTIVE:  Feeling a little better  VITAL SIGNS: BP 124/79 (BP Location: Left Arm)   Pulse 84   Temp (!) 97.2 F (36.2 C)   Resp (!) 22   Ht 5\' 2"  (1.575 m)   Wt 240 lb (108.9 kg)   SpO2 99%   BMI 43.90 kg/m   HEMODYNAMICS:    VENTILATOR SETTINGS:    INTAKE / OUTPUT: No intake/output data recorded.  PHYSICAL EXAMINATION: General: Obese 66 year old female resting comfortably in stretcher no acute distress Neuro: Awake alert no focal deficits HEENT: Normocephalic atraumatic no jugular venous distention mucous membranes are moist Cardiovascular: Regular rate and rhythm without murmur rub or gallop Lungs: Some scattered rhonchi, no accessory muscle use, chest pain still complains in mediastinal region Abdomen: Soft nontender no organomegaly Musculoskeletal: Equal strength and bulk Skin: Warm and dry  LABS:  BMET Recent Labs  Lab 10/31/17 0750  NA 140  K 3.8  CL 108  CO2 23  BUN 17  CREATININE 0.90  GLUCOSE 99    Electrolytes Recent Labs  Lab 10/31/17 0750  CALCIUM 9.8    CBC Recent Labs  Lab 10/31/17 0750  WBC 10.3  HGB 14.5  HCT 44.5  PLT 204    Coag's Recent Labs  Lab 10/31/17 0750  APTT 27  INR 0.99    Sepsis Markers No results for input(s): LATICACIDVEN, PROCALCITON, O2SATVEN in the  last 168 hours.  ABG No results for input(s): PHART, PCO2ART, PO2ART in the last 168 hours.  Liver Enzymes Recent Labs  Lab 10/31/17 0750  AST 26  ALT 27  ALKPHOS 93  BILITOT 0.4  ALBUMIN 3.6    Cardiac Enzymes No results for input(s): TROPONINI, PROBNP in the last 168 hours.  Glucose No results for input(s): GLUCAP in the last 168 hours.  Imaging Dg Chest Port 1 View  Result Date: 10/31/2017 CLINICAL DATA:  S/p bronch  Hx of asthma, pulmonary nodule, PVD EXAM: PORTABLE CHEST 1 VIEW COMPARISON:  CT 10/26/2017 FINDINGS: Midline trachea. Cardiomegaly accentuated by AP portable technique. No right and no old definite left-sided pleural fluid. Mildly degraded exam due to AP portable technique and patient body habitus. No pneumothorax. Volume loss and subsegmental atelectasis at the left lung base laterally. Nodularity at the left costophrenic angle, as on CT. IMPRESSION: Cardiomegaly and low lung volumes. No pneumothorax or other acute complication. Electronically Signed   By: Abigail Miyamoto M.D.   On: 10/31/2017 12:36   Dg C-arm Bronchoscopy  Result Date: 10/31/2017 C-ARM BRONCHOSCOPY: Fluoroscopy was utilized by the requesting physician.  No radiographic interpretation.     STUDIES:    CULTURES:   ANTIBIOTICS:  SIGNIFICANT EVENTS:   LINES/TUBES:    ASSESSMENT / PLAN:  Post op chest pain, dyspnea & mild hemoptysis -Personal review of chest x-ray is negative for pneumothorax. low lung volumes.  PTX would be primary concern given constellation of symptoms -She is feeling a little better Plan Admit to stepdown setting Wean oxygen PRN analgesia Repeat chest x-ray this afternoon rule out pneumothorax 12-lead EKG Cardiac enzymes for completeness Hold aspirin for now  Mediastinal lymphadenopathy with distal pulmonary nodule -Preliminary note positive for cancer per Dr. Lamonte Sakai Plan Follow-up Dr. West Carbo outpatient setting Oncology referral as  outpatient  History of reactive airway disease/asthma  Plan As needed albuterol  History of sleep apnea Plan Nocturnal CPAP   Erick Colace ACNP-BC [place] Pager # [phone] OR # [phone] if no answer  10/31/2017, 12:57 PM

## 2017-10-31 NOTE — Discharge Instructions (Signed)
Flexible Bronchoscopy, Care After These instructions give you information on caring for yourself after your procedure. Your doctor may also give you more specific instructions. Call your doctor if you have any problems or questions after your procedure. Follow these instructions at home:  Do not eat or drink anything for 2 hours after your procedure. If you try to eat or drink before the medicine wears off, food or drink could go into your lungs. You could also burn yourself.  After 2 hours have passed and when you can cough and gag normally, you may eat soft food and drink liquids slowly.  The day after the test, you may eat your normal diet.  You may do your normal activities.  Keep all doctor visits. Get help right away if:  You get more and more short of breath.  You get light-headed.  You feel like you are going to pass out (faint).  You have chest pain.  You have new problems that worry you.  You cough up more than a little blood.  You cough up more blood than before. This information is not intended to replace advice given to you by your health care provider. Make sure you discuss any questions you have with your health care provider. Document Released: 08/27/2009 Document Revised: 04/06/2016 Document Reviewed: 07/04/2013 Elsevier Interactive Patient Education  2017 [place].  Please call our office for any questions or concerns. [phone].

## 2017-10-31 NOTE — Transfer of Care (Signed)
Immediate Anesthesia Transfer of Care Note  Patient: Natasha Peters  Procedure(s) Performed: VIDEO BRONCHOSCOPY WITH ENDOBRONCHIAL NAVIGATION (N/A Chest) VIDEO BRONCHOSCOPY WITH ENDOBRONCHIAL ULTRASOUND (N/A Chest)  Patient Location: PACU  Anesthesia Type:General  Level of Consciousness: awake and alert   Airway & Oxygen Therapy: Patient Spontanous Breathing and Patient connected to face mask oxygen  Post-op Assessment: Report given to RN and Post -op Vital signs reviewed and stable  Post vital signs: Reviewed and stable  Last Vitals:  Vitals:   10/31/17 0724 10/31/17 0726  BP: 136/86 (!) 129/96  Pulse: 76   Resp: 19   Temp: 36.6 C   SpO2: 96%     Last Pain:  Vitals:   10/31/17 0806  TempSrc:   PainSc: 4       Patients Stated Pain Goal: 6 (86/76/19 5093)  Complications: No apparent anesthesia complications

## 2017-11-01 ENCOUNTER — Other Ambulatory Visit: Payer: Self-pay

## 2017-11-01 ENCOUNTER — Encounter (HOSPITAL_COMMUNITY): Payer: Self-pay | Admitting: *Deleted

## 2017-11-01 ENCOUNTER — Inpatient Hospital Stay (HOSPITAL_COMMUNITY): Payer: Medicare HMO

## 2017-11-01 DIAGNOSIS — C969 Malignant neoplasm of lymphoid, hematopoietic and related tissue, unspecified: Secondary | ICD-10-CM | POA: Diagnosis not present

## 2017-11-01 DIAGNOSIS — R911 Solitary pulmonary nodule: Secondary | ICD-10-CM

## 2017-11-01 LAB — BASIC METABOLIC PANEL
ANION GAP: 7 (ref 5–15)
BUN: 12 mg/dL (ref 6–20)
CALCIUM: 9.5 mg/dL (ref 8.9–10.3)
CO2: 26 mmol/L (ref 22–32)
Chloride: 108 mmol/L (ref 101–111)
Creatinine, Ser: 0.84 mg/dL (ref 0.44–1.00)
Glucose, Bld: 106 mg/dL — ABNORMAL HIGH (ref 65–99)
Potassium: 4 mmol/L (ref 3.5–5.1)
Sodium: 141 mmol/L (ref 135–145)

## 2017-11-01 LAB — CBC
HCT: 43.8 % (ref 36.0–46.0)
HEMOGLOBIN: 14 g/dL (ref 12.0–15.0)
MCH: 29.3 pg (ref 26.0–34.0)
MCHC: 32 g/dL (ref 30.0–36.0)
MCV: 91.6 fL (ref 78.0–100.0)
Platelets: 235 10*3/uL (ref 150–400)
RBC: 4.78 MIL/uL (ref 3.87–5.11)
RDW: 14.5 % (ref 11.5–15.5)
WBC: 13.2 10*3/uL — ABNORMAL HIGH (ref 4.0–10.5)

## 2017-11-01 LAB — TROPONIN I

## 2017-11-01 MED ORDER — MOMETASONE FURO-FORMOTEROL FUM 100-5 MCG/ACT IN AERO
2.0000 | INHALATION_SPRAY | Freq: Two times a day (BID) | RESPIRATORY_TRACT | Status: DC
  Filled 2017-11-01: qty 8.8

## 2017-11-01 MED ORDER — MOMETASONE FURO-FORMOTEROL FUM 200-5 MCG/ACT IN AERO: 2.0000 | INHALATION_SPRAY | Freq: Two times a day (BID) | RESPIRATORY_TRACT | 12 refills | Status: DC

## 2017-11-01 MED ORDER — ALBUTEROL SULFATE (2.5 MG/3ML) 0.083% IN NEBU
2.5000 mg | INHALATION_SOLUTION | RESPIRATORY_TRACT | Status: AC
  Administered 2017-11-01: 2.5 mg via RESPIRATORY_TRACT
  Filled 2017-11-01: qty 3

## 2017-11-01 MED ORDER — ALBUTEROL SULFATE HFA 108 (90 BASE) MCG/ACT IN AERS: 2.0000 | INHALATION_SPRAY | Freq: Four times a day (QID) | RESPIRATORY_TRACT | 2 refills | Status: AC | PRN

## 2017-11-01 NOTE — Care Management Obs Status (Signed)
[place] NOTIFICATION   Patient Details  Name: Lively Haberman MRN: [id number] Date of Birth: January 08, 1951   Medicare Observation Status Notification Given:  Yes    Maryclare Labrador, RN 11/01/2017, 9:17 AM

## 2017-11-01 NOTE — Progress Notes (Signed)
Pt discharged per MD order, all discharge instructions reviewed and all questions answered.  

## 2017-11-01 NOTE — Discharge Summary (Signed)
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  Also carries a history of asthma for which she takes [facility] on a as needed basis.  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  Also has a history of CLL.  She was noted to have a peripheral lung nodule back in April 2018 repeat CT imaging in November 2018 demonstrated an enlargement of this nodule.  She therefore underwent a PET scan which abdomen was negative but did have hypermetabolic region of the pulmonary nodule.  Also enlarged hypermetabolic subcarinal and paratracheal mediastinal lymphadenopathy.  She was referred for tissue biopsy.  She underwent video bronchoscopy with endobronchial ultrasound and electromagnetic navigation on 10/31/2017.  Biopsies were obtained.  Postoperatively in the operating room the patient complained of significant chest discomfort, hemoptysis, and chest pain and emergent chest x-ray was obtained given concern for possible postprocedural pneumothorax this was negative, however her underlying pulmonary status remained for concern given concomitant hemoptysis and dyspnea she  was admitted for observation.  A repeat chest x-ray 12/20 did not show any evidence of pneumothorax, showed improving basilar atelectasis.  She was able to ambulate, felt somewhat better although still had some focal wheezing on the left.  She was stable and appropriate for discharge on 12/20.  Pathology will be discussed with her when available, likely on 12/21.  She will go home on scheduled Dulera (previously had been on [facility] as needed), albuterol as needed.  SUBJECTIVE:  Feeling a little better Still with some wheeze.  She was able to get up to restroom without assist  VITAL SIGNS: BP 111/65 (BP Location: Left Arm)   Pulse 83   Temp 98.1 F (36.7 C) (Oral)   Resp 20   Ht 5\' 2"  (1.575 m)   Wt 110.4 kg (243 lb 6.2 oz)   SpO2 93%   BMI 44.52 kg/m   INTAKE / OUTPUT: I/O last 3 completed shifts: In: 1850 [I.V.:1850] Out: 25 [Blood:25]  PHYSICAL EXAMINATION: General: Obese 66 year old female resting comfortably Neuro: Awake, alert, no focal deficits HEENT: Normocephalic atraumatic no jugular venous distention mucous membranes are moist Cardiovascular: Regular, no murmurs Lungs: Right side is clear.  Some focal left basilar expiratory wheeze Abdomen: Soft, benign, positive bowel sounds Musculoskeletal: No deformity Skin: Warm, no rash  LABS:  BMET Recent Labs  Lab 10/31/17 0750 10/31/17 1344 11/01/17 0333  NA 140 135 141  K 3.8 4.4 4.0  CL 108 104 108  CO2 23 24 26   BUN 17 17 12   CREATININE 0.90 1.00 0.84  GLUCOSE 99 117* 106*    Electrolytes Recent Labs  Lab 10/31/17 0750 10/31/17 1344 11/01/17 0333  CALCIUM 9.8 9.5 9.5    CBC Recent Labs  Lab 10/31/17 0750 11/01/17 0333  WBC 10.3  13.2*  HGB 14.5 14.0  HCT 44.5 43.8  PLT 204 235    Coag's Recent Labs  Lab 10/31/17 0750  APTT 27  INR 0.99    Sepsis Markers No results for input(s): LATICACIDVEN, PROCALCITON, O2SATVEN in the last 168 hours.  ABG No results for input(s): PHART, PCO2ART,  PO2ART in the last 168 hours.  Liver Enzymes Recent Labs  Lab 10/31/17 0750 10/31/17 1344  AST 26 31  ALT 27 29  ALKPHOS 93 84  BILITOT 0.4 0.6  ALBUMIN 3.6 3.7    Cardiac Enzymes Recent Labs  Lab 10/31/17 1344 10/31/17 1920 11/01/17 0002  TROPONINI <0.03 <0.03 <0.03    Glucose No results for input(s): GLUCAP in the last 168 hours.  Imaging Dg Chest Port 1 View  Result Date: 11/01/2017 CLINICAL DATA:  Chest pain after biopsy EXAM: PORTABLE CHEST 1 VIEW COMPARISON:  Yesterday FINDINGS: Linear densities at the left base consistent with atelectasis. Large lung volumes. No edema, effusion, or pneumothorax. Borderline cardiomegaly accentuated by mediastinal fat based on CT. IMPRESSION: 1. No pneumothorax or other acute finding. 2. Mildly improved lung volumes. Persistent atelectasis at the bases. Electronically Signed   By: Monte Fantasia M.D.   On: 11/01/2017 07:20   Dg Chest Port 1 View  Result Date: 10/31/2017 CLINICAL DATA:  Postoperative radiograph following left-sided bronchoscopy. EXAM: PORTABLE CHEST 1 VIEW COMPARISON:  Portable chest x-ray of earlier today. FINDINGS: The lungs are adequately inflated. The lung markings are coarse in the lower lung zones greatest on the left. This is stable. There is no post procedure pneumothorax or hemothorax. The cardiac silhouette is mildly enlarged. The pulmonary vascularity is normal. The trachea is midline. IMPRESSION: No acute postprocedure pneumothorax or hemothorax nor other acute change. There is bibasilar atelectasis which appears stable. Electronically Signed   By: David  Martinique M.D.   On: 10/31/2017 14:58   Dg Chest Port 1 View  Result Date: 10/31/2017 CLINICAL DATA:  S/p bronch  Hx of asthma, pulmonary nodule, PVD EXAM: PORTABLE CHEST 1 VIEW COMPARISON:  CT 10/26/2017 FINDINGS: Midline trachea. Cardiomegaly accentuated by AP portable technique. No right and no old definite left-sided pleural fluid. Mildly degraded exam due  to AP portable technique and patient body habitus. No pneumothorax. Volume loss and subsegmental atelectasis at the left lung base laterally. Nodularity at the left costophrenic angle, as on CT. IMPRESSION: Cardiomegaly and low lung volumes. No pneumothorax or other acute complication. Electronically Signed   By: Abigail Miyamoto M.D.   On: 10/31/2017 12:36   Dg C-arm Bronchoscopy  Result Date: 10/31/2017 C-ARM BRONCHOSCOPY: Fluoroscopy was utilized by the requesting physician.  No radiographic interpretation.    Discharge Instructions:  Dr. Lamonte Sakai will review pathology results on 12/21 by phone Start using Dulera 2 puffs twice a day instead of as needed We will provide an albuterol inhaler to use 2 puffs up to every 4 hours if needed for shortness of breath Follow-up with [facility] at [facility] to discuss path results and to plan next steps for cancer therapy Continue CPAP every night  Follow up visits:  Dr Rudean Hitt, [facility], next week, patient to arrange Dr Lamonte Sakai 11/23/16  Discharge meds:  Allergies as of 11/01/2017      Reactions   Actonel [risedronate Sodium] Swelling   Drixoral Allergy Sinus [dexbromphen-pse-apap Er] Swelling   Nsaids Diarrhea   Bloody stool   Other    No seeds, nuts, or husk due to diverticulitis      Medication  List    TAKE these medications   albuterol 108 (90 Base) MCG/ACT inhaler Commonly known as:  PROVENTIL HFA;VENTOLIN HFA Inhale 2 puffs into the lungs every 6 (six) hours as needed for wheezing or shortness of breath.   aspirin EC 81 MG tablet Take 81 mg by mouth daily.   CALCIUM 600 + D PO Take 1 tablet by mouth daily.   cetirizine 10 MG tablet Commonly known as:  ZYRTEC Take 10 mg by mouth daily as needed for allergies.   Flaxseed Oil 1000 MG Caps Take 1,000 mg by mouth daily.   KRILL OIL PO Take 1 capsule by mouth daily.   LUBRICATING EYE DROPS OP Apply 1 drop to eye daily as needed (dry eyes).    mometasone-formoterol 200-5 MCG/ACT Aero Commonly known as:  DULERA Inhale 2 puffs into the lungs 2 (two) times daily. What changed:    when to take this  reasons to take this   multivitamin with minerals Tabs tablet Take 1 tablet by mouth daily.   pravastatin 80 MG tablet Commonly known as:  PRAVACHOL Take 80 mg by mouth daily.   PRESERVISION AREDS 2 PO Take 1 tablet by mouth 2 (two) times daily.   triamterene-hydrochlorothiazide 75-50 MG tablet Commonly known as:  MAXZIDE Take 0.5 tablets by mouth daily as needed (for swelling).   TURMERIC PO Take 1 capsule by mouth daily.   UDDERLY SMOOTH Crea Apply 1 application topically daily as needed (dry skin).   Vitamin D3 1000 units Caps Take 1,000 Units by mouth daily.       Time spent on discharge 34 minutes  Baltazar Apo, MD, PhD 11/01/2017, 8:53 AM [place] [phone] or if no answer [phone]

## 2017-11-01 NOTE — Care Management CC44 (Signed)
Condition Code 44 Documentation Completed  Patient Details  Name: Natasha Peters MRN: [id number] Date of Birth: 09-17-1951   Condition Code 44 given:  Yes Patient signature on Condition Code 44 notice:  Yes Documentation of 2 MD's agreement:  Yes Code 44 added to claim:  Yes    Maryclare Labrador, RN 11/01/2017, 9:18 AM

## 2017-11-02 ENCOUNTER — Telehealth: Payer: Self-pay | Admitting: Emergency Medicine

## 2017-11-02 NOTE — Telephone Encounter (Signed)
Spoke with pt's daughter (dpr on file), made aware of RB's recs. Forwarding back to RB to follow up on after biopsy results are finalized

## 2017-11-02 NOTE — Telephone Encounter (Signed)
Please let the patient know that we are still waiting for the special stains to get done on her nodal biopsies. I will call her next week with results.

## 2017-11-05 NOTE — Telephone Encounter (Signed)
I notified the patient last week that her biopsies showed carcinoma. We have been waiting for special staining to result to better characterize the type of cancer present in her lymph node biopsies. Please let the patient know that the addendum to path report shows cells are colon cancer cells.   Also, we need to get this info to Dr Rudean Hitt at [facility]. Please fax to him. Thanks.

## 2017-11-07 NOTE — Telephone Encounter (Signed)
Called and spoke to pt and pt's daughter, [name] who is the DPR on file letting them both know the result of the path report. They both understood. Will fax this information to Dr. Rudean Hitt at [facility]. Nothing further needed.

## 2017-11-14 ENCOUNTER — Telehealth: Payer: Self-pay | Admitting: Emergency Medicine

## 2017-11-14 DIAGNOSIS — R918 Other nonspecific abnormal finding of lung field: Secondary | ICD-10-CM

## 2017-11-14 NOTE — Telephone Encounter (Signed)
I spoke with Dr Rudean Hitt today. 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 He remains concerned that the lingular nodule could still be a primary lung CA. My TBBx's were non-diagnostic. He has spoken to the patient about having a TTNA in Interventional radiology. She agrees. I will order and arrange here in [place].   Please place an order for needle biopsy lingular lung nodule with IR. Thanks

## 2017-11-14 NOTE — Telephone Encounter (Signed)
Order has been placed per RB. 

## 2017-11-19 ENCOUNTER — Telehealth (HOSPITAL_COMMUNITY): Payer: Self-pay | Admitting: Radiology

## 2017-11-19 NOTE — Telephone Encounter (Signed)
Called pt to schedule her CT guided left lung biopsy. Pt states that she does not know anything about this. She says she has an upcoming appointment with Dr. Lamonte Sakai and Dr. Rudean Hitt on 11/23/17 and would like to see them first to see if she does in fact need to schedule this biopsy. She states Dr. Lamonte Sakai has already performed a lung biopsy and she is confused as to why she would need another. I told her that we will wait until she sees both physicians and regroup at that time. Patient agrees with this plan of care. JM

## 2017-11-23 ENCOUNTER — Encounter: Payer: Self-pay | Admitting: Emergency Medicine

## 2017-11-23 ENCOUNTER — Ambulatory Visit: Payer: Medicare HMO | Admitting: Emergency Medicine

## 2017-11-23 DIAGNOSIS — R590 Localized enlarged lymph nodes: Secondary | ICD-10-CM

## 2017-11-23 DIAGNOSIS — R911 Solitary pulmonary nodule: Secondary | ICD-10-CM | POA: Diagnosis not present

## 2017-11-23 MED ORDER — MOMETASONE FURO-FORMOTEROL FUM 200-5 MCG/ACT IN AERO: 2.0000 | INHALATION_SPRAY | Freq: Two times a day (BID) | RESPIRATORY_TRACT | 0 refills | Status: DC

## 2017-11-23 NOTE — Assessment & Plan Note (Addendum)
Lingular nodule.  Hypermetabolic on PET scan.  Unfortunately transbronchial biopsies were difficult and nondiagnostic.  I suspect that this is metastatic colon cancer given its round appearance and the presence of documented mediastinal metastatic disease.  Likelihood of a new synchronous primary lung cancer is quite low.  If it were a primary lung cancer then it is possible that the patient would be considered for primary resection in parallel with treatment for colon cancer.  Not sure that thoracic surgery would pursue or endorse such a plan.  Dr. Rudean Hitt has requested a transthoracic needle biopsy but the patient has not undergone.  In truth am not sure she needs to given the low likelihood that this is a synchronous lung primary.  As above the patient is going to discuss with him today.  If it is recommended that she have a needle biopsy, therapy to proceed then she will have to decide whether she wants to go along with this.  If she is unsure after hearing all the information from oncology at [facility] then I have told her I would be willing to arrange for a second opinion to help her decide.  45 minutes spend discussing data, options for next steps.

## 2017-11-23 NOTE — Patient Instructions (Signed)
Discussed the bx results in detail.  Follow up prn

## 2017-11-23 NOTE — Progress Notes (Signed)
Subjective:    Patient ID: Natasha Peters, female    DOB: 07-25-1951, 67 y.o.   MRN: [id number]  HPI 67 year old former smoker (40 pack years) with a history of sleep apnea on CPAP, asthma that was made 3-4 years ago. She has Dulera that she uses prn. Uses zyrtec prn as well.   She has adenocarcinoma of the sigmoid colon status post sigmoid colectomy 09/2016, adjuvant chemotherapy completed 01/2017, managed by Dr Rudean Hitt.  1 out of 13 nodes positive for involvement.  Also with history of CLL.  She was noted to have a peripheral lingular pulmonary nodule on CT chest 02/2017.  Enlarged and more solid on repeat CT 09/24/17 scan that I have reviewed from 09/07/17.  This prompted a PET scan that was done on 09/24/17 that shows no abdominal hypermetabolism but confirmed hyper metabolic 1.7 cm solid lingular pulmonary nodule, enlarged and hypermetabolic subcarinal and right lower paratracheal mediastinal lymphadenopathy.  She saw Dr. Freda Munro with thoracic surgery, and is referred now for planning tissue diagnosis.  ROV 11/23/17 --Natasha Peters is 67 years old and has a history of colon cancer followed at [facility].  Also a history of CLL.  I saw her for a peripheral lingular nodule and mediastinal lymphadenopathy.  We performed a E bus and then transbronchial biopsies.  The nodal biopsies were consistent with metastatic colon adenocarcinoma.  The lingular biopsies were nondiagnostic.  She returns today for follow-up.  She discussed the results with Dr Rudean Hitt and he is concerned that the lingular nodule could still be unrelated to the mediastinal findings.  He asked for a needle biopsy of the lesion.   Review of Systems  Constitutional: Negative for fever and unexpected weight change.  HENT: Negative for congestion, dental problem, ear pain, nosebleeds, postnasal drip, rhinorrhea, sinus pressure, sneezing, sore throat and trouble swallowing.   Eyes: Negative for redness and itching.  Respiratory: Negative  for cough, chest tightness, shortness of breath and wheezing.   Cardiovascular: Negative for palpitations and leg swelling.  Gastrointestinal: Negative for nausea and vomiting.  Genitourinary: Negative for dysuria.  Musculoskeletal: Negative for joint swelling.  Skin: Negative for rash.  Neurological: Negative for headaches.  Hematological: Does not bruise/bleed easily.  Psychiatric/Behavioral: Negative for dysphoric mood. The patient is not nervous/anxious.     Past Medical History:  Diagnosis Date  . Arthritis   . Asthma   . Astigmatism with presbyopia   . Benign neoplasm of right choroid   . Choroidal nevus, right   . Colon cancer ([place])   . Colon neoplasm   . Colon polyp   . Diverticulitis   . Hemorrhoids   . History of kidney stones    pmh  . Hx of gallstones    had chole  . Hyperlipidemia   . 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 Mediastinal lymphadenopathy   . Posterior capsular opacification non visually significant of left eye   . Pseudophakia of both eyes   . Pulmonary nodule   . PVD (posterior vitreous detachment), both eyes   . Sleep apnea      Family History  Problem Relation Age of Onset  . Arthritis Father   . Hypertension Father   . Macular degeneration Father   . Macular degeneration Sister   . Diabetes Sister   . Cataracts Sister   . Hypertension Brother   . Diabetes Brother   . Macular degeneration Maternal Aunt      Social History  Socioeconomic History  . Marital status: Widowed    Spouse name: Not on file  . Number of children: Not on file  . Years of education: Not on file  . Highest education level: Not on file  Social Needs  . Financial resource strain: Not on file  . Food insecurity - worry: Not on file  . Food insecurity - inability: Not on file  . Transportation needs - medical: Not on file  . Transportation needs - non-medical: Not on file  Occupational History  . Not on file  Tobacco Use   . Smoking status: Former Smoker    Packs/day: 1.00    Years: 40.00    Pack years: 40.00    Types: Cigarettes    Last attempt to quit: 11/14/2003    Years since quitting: 14.0  . Smokeless tobacco: Never Used  Substance and Sexual Activity  . Alcohol use: No    Frequency: Never  . Drug use: No  . Sexual activity: Not on file  Other Topics Concern  . Not on file  Social History Narrative  . Not on file     Allergies  Allergen Reactions  . Actonel [Risedronate Sodium] Swelling  . Drixoral Allergy Sinus [Dexbromphen-Pse-Apap Er] Swelling  . Nsaids Diarrhea    Bloody stool  . Other     No seeds, nuts, or husk due to diverticulitis     Outpatient Medications Prior to Visit  Medication Sig Dispense Refill  . albuterol (PROVENTIL HFA;VENTOLIN HFA) 108 (90 Base) MCG/ACT inhaler Inhale 2 puffs into the lungs every 6 (six) hours as needed for wheezing or shortness of breath. 1 Inhaler 2  . aspirin EC 81 MG tablet Take 81 mg by mouth daily.    . Calcium Carb-Cholecalciferol (CALCIUM 600 + D PO) Take 1 tablet by mouth daily.    . Carboxymethylcellul-Glycerin (LUBRICATING EYE DROPS OP) Apply 1 drop to eye daily as needed (dry eyes).    . cetirizine (ZYRTEC) 10 MG tablet Take 10 mg by mouth daily as needed for allergies.     . Cholecalciferol (VITAMIN D3) 1000 units CAPS Take 1,000 Units by mouth daily.     . Emollient (UDDERLY SMOOTH) CREA Apply 1 application topically daily as needed (dry skin).    . Flaxseed, Linseed, (FLAXSEED OIL) 1000 MG CAPS Take 1,000 mg by mouth daily.     Marland Kitchen KRILL OIL PO Take 1 capsule by mouth daily.    . mometasone-formoterol (DULERA) 200-5 MCG/ACT AERO Inhale 2 puffs into the lungs 2 (two) times daily. 1 Inhaler 12  . Multiple Vitamin (MULTIVITAMIN WITH MINERALS) TABS tablet Take 1 tablet by mouth daily.    . Multiple Vitamins-Minerals (PRESERVISION AREDS 2 PO) Take 1 tablet by mouth 2 (two) times daily.     . pravastatin (PRAVACHOL) 80 MG tablet Take 80 mg by  mouth daily.    Marland Kitchen triamterene-hydrochlorothiazide (MAXZIDE) 75-50 MG tablet Take 0.5 tablets by mouth daily as needed (for swelling).     . TURMERIC PO Take 1 capsule by mouth daily.     No facility-administered medications prior to visit.         Objective:   Physical Exam Vitals:   11/23/17 1200  BP: 136/82  Pulse: (!) 106  SpO2: 98%  Weight: 245 lb 3.2 oz (111.2 kg)  Height: 5\' 2"  (1.575 m)   Gen: Pleasant, well-nourished, in no distress,  normal affect  ENT: No lesions,  mouth clear,  oropharynx clear, no postnasal drip  Neck:  No JVD, no TMG, no carotid bruits  Lungs: No use of accessory muscles, no dullness to percussion, clear without rales or rhonchi  Cardiovascular: RRR, heart sounds normal, no murmur or gallops, no peripheral edema  Musculoskeletal: No deformities, no cyanosis or clubbing  Neuro: alert, non focal  Skin: Warm, no lesions or rashes     Assessment & Plan:  Mediastinal lymphadenopathy Pathology from 4R and 7 are both consistent with adenocarcinoma, immunohistochemical staining consistent with colon cancer.  She is planning to discuss with Dr Rudean Hitt today after our visit.   Pulmonary nodule, left Lingular nodule.  Hypermetabolic on PET scan.  Unfortunately transbronchial biopsies were difficult and nondiagnostic.  I suspect that this is metastatic colon cancer given its round appearance and the presence of documented mediastinal metastatic disease.  Likelihood of a new synchronous primary lung cancer is quite low.  If it were a primary lung cancer then it is possible that the patient would be considered for primary resection in parallel with treatment for colon cancer.  Not sure that thoracic surgery would pursue or endorse such a plan.  Dr. Rudean Hitt has requested a transthoracic needle biopsy but the patient has not undergone.  In truth am not sure she needs to given the low likelihood that this is a synchronous lung primary.  As above the patient is  going to discuss with him today.  If it is recommended that she have a needle biopsy, therapy to proceed then she will have to decide whether she wants to go along with this.  If she is unsure after hearing all the information from oncology at [facility] then I have told her I would be willing to arrange for a second opinion to help her decide.  Baltazar Apo, MD, PhD 11/23/2017, 1:16 PM [facility] [phone] or if no answer [phone]

## 2017-11-23 NOTE — Addendum Note (Signed)
Addended by: Elton Sin on: 11/23/2017 02:37 PM   Modules accepted: Orders

## 2017-11-23 NOTE — Assessment & Plan Note (Signed)
Pathology from 4R and 7 are both consistent with adenocarcinoma, immunohistochemical staining consistent with colon cancer.  She is planning to discuss with Dr Rudean Hitt today after our visit.

## 2017-12-12 ENCOUNTER — Telehealth: Payer: Self-pay | Admitting: Emergency Medicine

## 2017-12-12 NOTE — Telephone Encounter (Signed)
Thank you very much for letting me know ?

## 2017-12-12 NOTE — Telephone Encounter (Signed)
Will forward this over to Dr. Lamonte Sakai to make him aware of it.

## 2018-03-13 IMAGING — CT CT CHEST SUPER D W/O CM
2 of 4 series · 15 of 36 positions shown, 18 images · non-contrast
Comparison: None.

CLINICAL DATA: Evaluate lung mass

EXAM:
CT CHEST WITHOUT CONTRAST
TECHNIQUE: Multidetector CT imaging of the chest was performed using thin slice
collimation for electromagnetic bronchoscopy planning purposes,
without intravenous contrast.

[Series 4: thins · axial · 0.83mm/px · z∈[-336,-81]mm · 12 of 357 slices shown, 15 images]
[im 19/357  mediastinal]
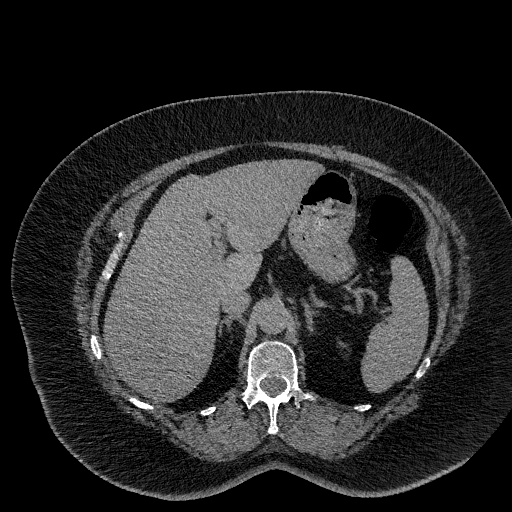
[im 19/357  lung]
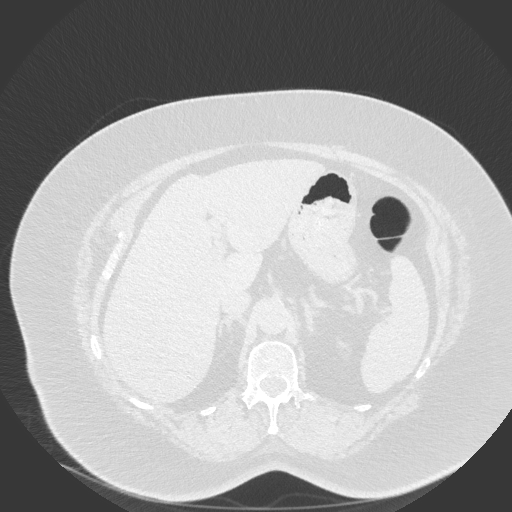
[im 57/357  lung]
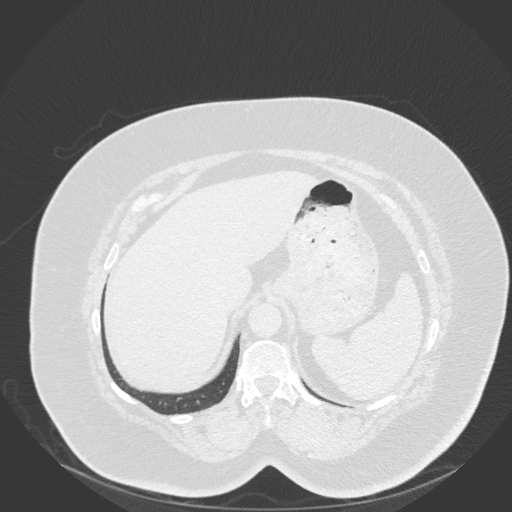
[im 75/357  lung]
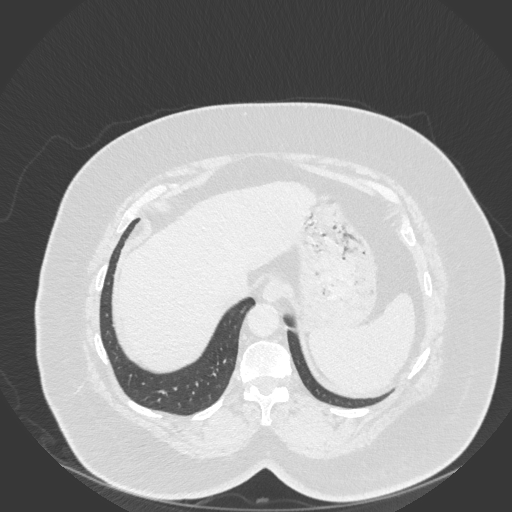
[im 113/357  lung]
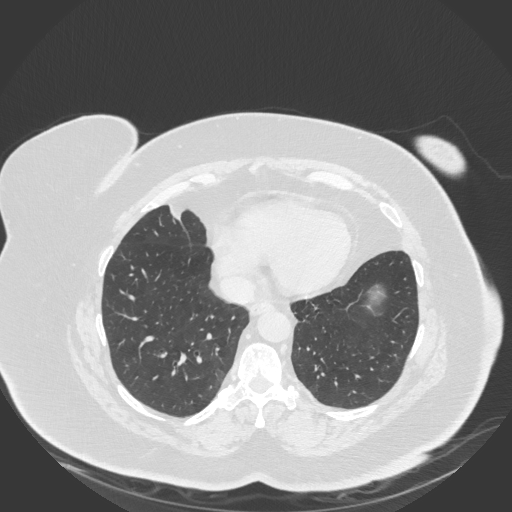
[im 132/357  mediastinal]
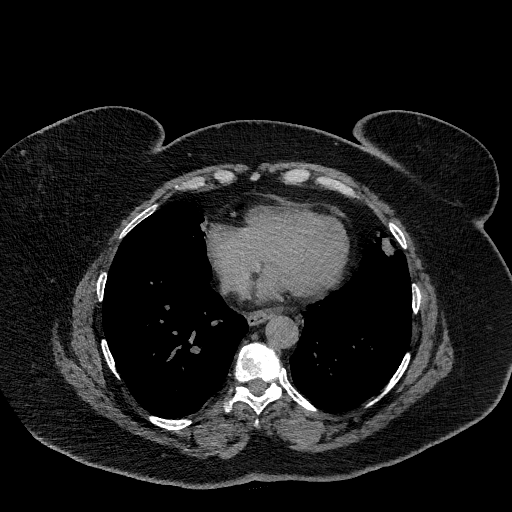
[im 132/357  lung]
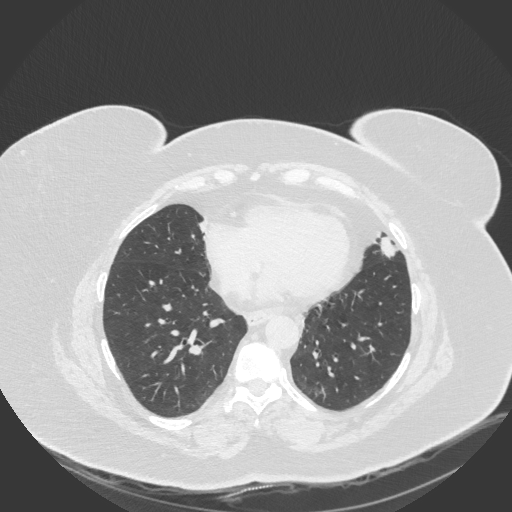
[im 169/357  lung]
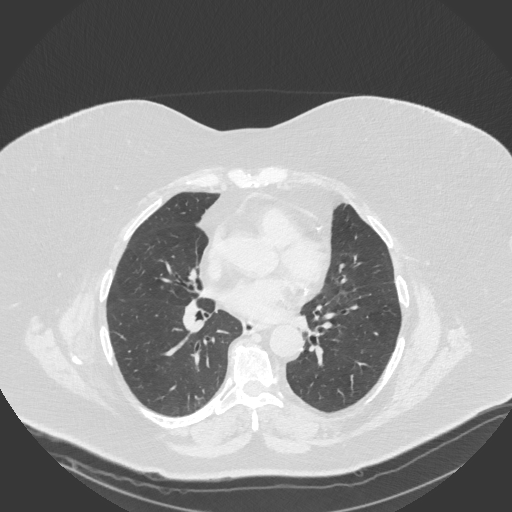
[im 188/357  lung]
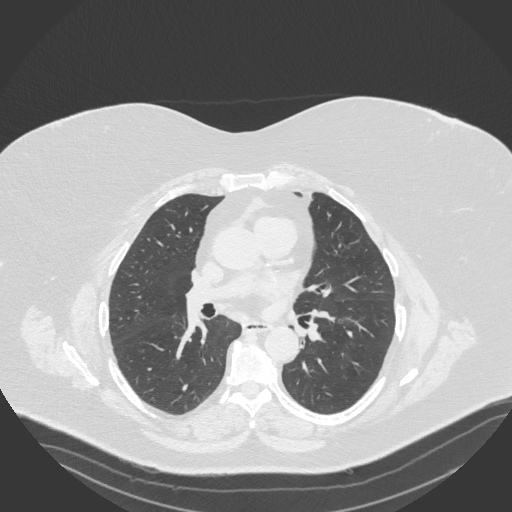
[im 225/357  lung]
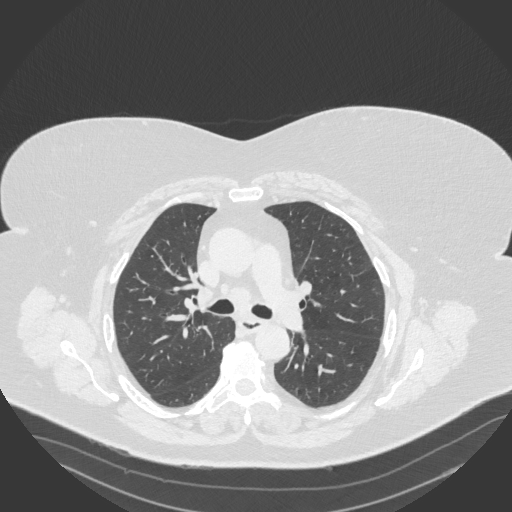
[im 244/357  mediastinal]
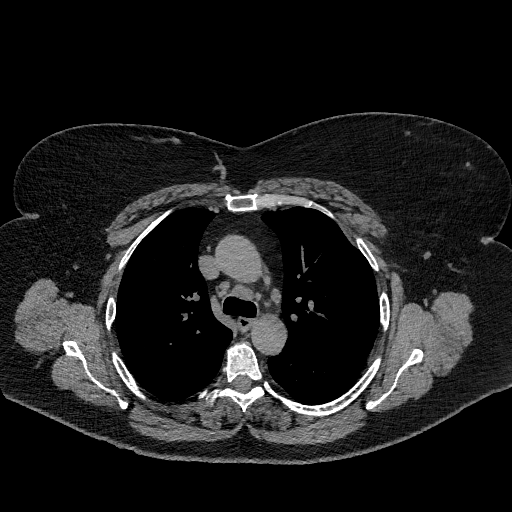
[im 244/357  lung]
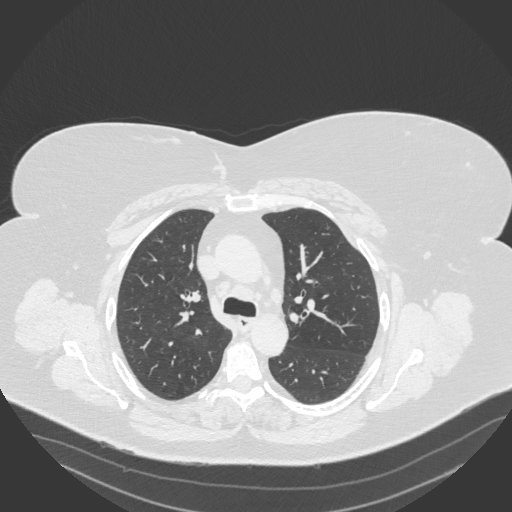
[im 282/357  lung]
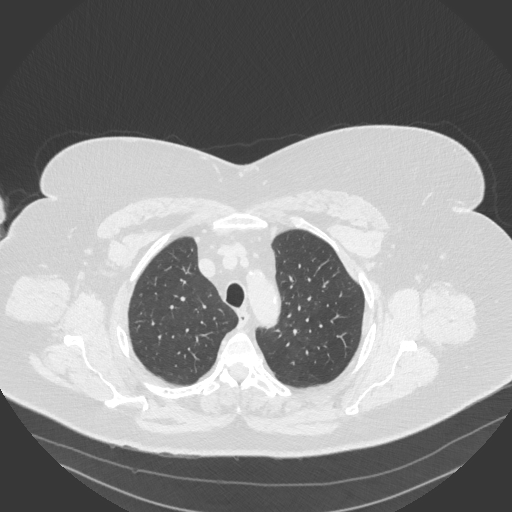
[im 300/357  lung]
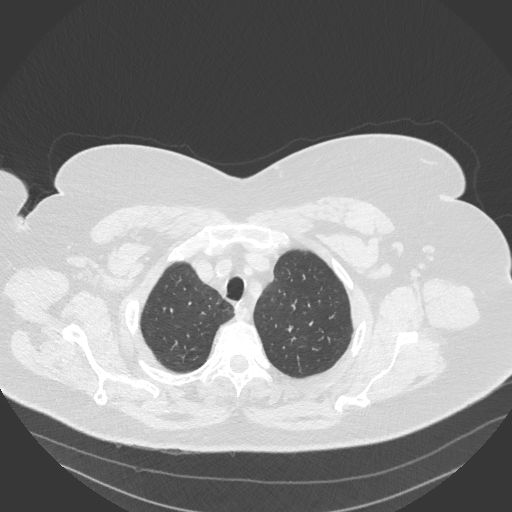
[im 338/357  lung]
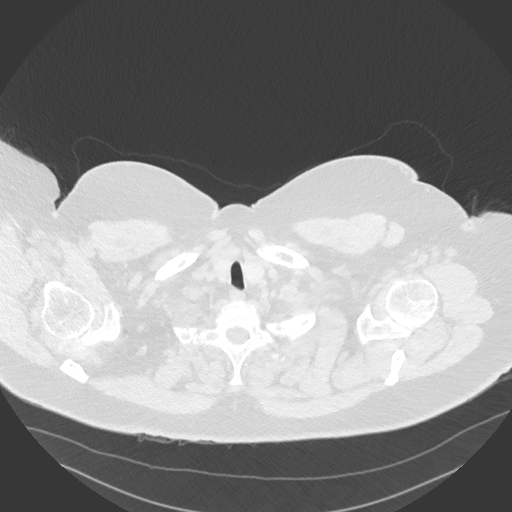

[Series 5: coronal · coronal · 0.59mm/px · 3 of 84 slices shown]
[im 17/84  lung]
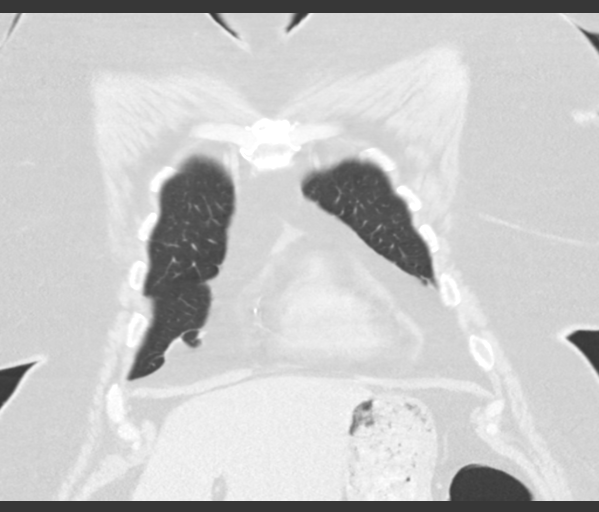
[im 34/84  lung]
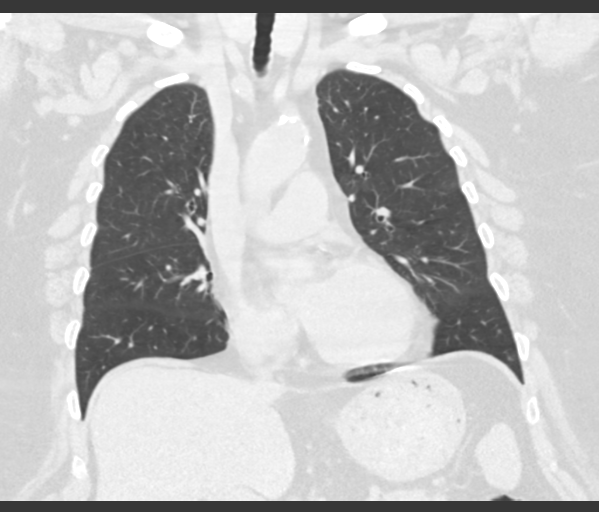
[im 50/84  lung]
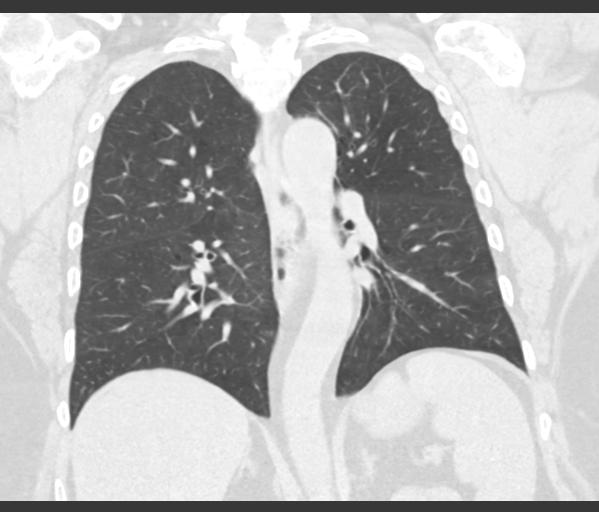

[15 of 36 positions shown; findings below may reference images not displayed]

FINDINGS: Cardiovascular: Coronary artery calcification and aortic
atherosclerotic calcification.

Mediastinum/Nodes: No axillary or supraclavicular adenopathy.
Precarinal lymph node measures 1.5 cm. Subcarinal lymph node
measures 1.4 cm. Smaller paratracheal nodes. No supraclavicular
nodes. No clear hilar adenopathy

Lungs/Pleura: Pleural-parenchymal nodule inferior lingula measures
1.6 cm (image 91, series 3). Airways normal

Upper Abdomen: Limited view of the liver, kidneys, pancreas are
unremarkable. Normal adrenal glands.

Musculoskeletal: No aggressive osseous lesion.
IMPRESSION: 1. Nodular pleuroparenchymal thickening in the inferior lingula is
indeterminate.
2. Mediastinal lymphadenopathy is indeterminate.

Aortic Atherosclerosis ([id number]).

## 2018-04-01 ENCOUNTER — Ambulatory Visit: Payer: Medicare HMO | Admitting: Emergency Medicine

## 2018-04-01 ENCOUNTER — Encounter: Payer: Self-pay | Admitting: Emergency Medicine

## 2018-04-01 VITALS — BP 132/88 | HR 107 | Ht 62.0 in | Wt 247.4 lb

## 2018-04-01 DIAGNOSIS — J45909 Unspecified asthma, uncomplicated: Secondary | ICD-10-CM

## 2018-04-01 DIAGNOSIS — G4733 Obstructive sleep apnea (adult) (pediatric): Secondary | ICD-10-CM | POA: Diagnosis not present

## 2018-04-01 DIAGNOSIS — R0602 Shortness of breath: Secondary | ICD-10-CM

## 2018-04-01 DIAGNOSIS — R0600 Dyspnea, unspecified: Secondary | ICD-10-CM | POA: Insufficient documentation

## 2018-04-01 MED ORDER — UMECLIDINIUM-VILANTEROL 62.5-25 MCG/INH IN AEPB: 1.0000 | INHALATION_SPRAY | Freq: Every day | RESPIRATORY_TRACT | 0 refills | Status: DC

## 2018-04-01 NOTE — Assessment & Plan Note (Signed)
On CPAP 8.  I am interested in changing her to an auto titration mode to see if she may need additional pressure to get adequate therapy.  I will change her to auto titration 8 to 20 cm water

## 2018-04-01 NOTE — Assessment & Plan Note (Signed)
Progressive dyspnea over the last several months.  Certainly her chemotherapy could be a contributor, her recent port infection could be a contributor.  Her most recent PET scan did not show any evidence of evolving pneumonitis, interstitial disease.  If so I would expect her to begin to recover.  She is at risk for pulmonary embolism but has been on Xarelto for upper extremity DVT.  She has obstructive lung disease that may be undertreated and I will try altering her bronchodilator regimen.  Walking oximetry today to ensure that she does not desaturate.

## 2018-04-01 NOTE — Patient Instructions (Signed)
We will change your CPAP pressure to see if we can optimize your breathing at night Walking oximetry today We will temporarily stop Dulera Please start Anoro 1 inhalation once a day.  Keep track of whether you feel like to get additional benefit from this medication.  If so then we will work on prescribing it through your pharmacy. Continue Xarelto as ordered. Follow with Dr Lamonte Sakai in 1 month or next available to assess your progress

## 2018-04-01 NOTE — Assessment & Plan Note (Signed)
Based on pulmonary function testing from December 2018.  She did benefit from [facility].  I like to try changing her to Anoro to see if she will derive more benefit.

## 2018-04-01 NOTE — Progress Notes (Signed)
Subjective:    Patient ID: Natasha Peters, female    DOB: 12-Dec-1950, 67 y.o.   MRN: [id number]  HPI 67 year old former smoker (40 pack years) with a history of sleep apnea on CPAP, asthma that was made 3-4 years ago. She has Dulera that she uses prn. Uses zyrtec prn as well.   She has adenocarcinoma of the sigmoid colon status post sigmoid colectomy 09/2016, adjuvant chemotherapy completed 01/2017, managed by Dr Natasha Peters.  1 out of 13 nodes positive for involvement.  Also with history of CLL.  She was noted to have a peripheral lingular pulmonary nodule on CT chest 02/2017.  Enlarged and more solid on repeat CT 09/24/17 scan that I have reviewed from 09/07/17.  This prompted a PET scan that was done on 09/24/17 that shows no abdominal hypermetabolism but confirmed hyper metabolic 1.7 cm solid lingular pulmonary nodule, enlarged and hypermetabolic subcarinal and right lower paratracheal mediastinal lymphadenopathy.  She saw Dr. Freda Peters with thoracic surgery, and is referred now for planning tissue diagnosis.  ROV 11/23/17 --[name] is 67 years old and has a history of colon cancer followed at [facility].  Also a history of CLL.  I saw her for a peripheral lingular nodule and mediastinal lymphadenopathy.  We performed a E bus and then transbronchial biopsies.  The nodal biopsies were consistent with metastatic colon adenocarcinoma.  The lingular biopsies were nondiagnostic.  She returns today for follow-up.  She discussed the results with Dr Natasha Peters and he is concerned that the lingular nodule could still be unrelated to the mediastinal findings.  He asked for a needle biopsy of the lesion.  ROV 04/01/18 --67 year old woman with a history of colon cancer and CLL, OSA, obstruction on PFT.  She underwent endobronchial ultrasound and transbronchial biopsies for peripheral lingular nodule and mediastinal lymphadenopathy.  The mediastinum was consistent with metastatic colon adenocarcinoma. She underwent  further chemo x 4. Her port was removed due to infection 6 weeks ago.  She is referred back today for evaluation of progressive SOB. PET 03/26/18 >> no new ILD or pneumonitis, mediastinal LAD shrinking, lingular nodule stable. She is on [facility], has benefited. She has gained about 6 lbs. She is on xarelto for DVT associated with her port. She is complaint with CPAP, still naps during the day.    Review of Systems  Constitutional: Negative for fever and unexpected weight change.  HENT: Negative for congestion, dental problem, ear pain, nosebleeds, postnasal drip, rhinorrhea, sinus pressure, sneezing, sore throat and trouble swallowing.   Eyes: Negative for redness and itching.  Respiratory: Negative for cough, chest tightness, shortness of breath and wheezing.   Cardiovascular: Negative for palpitations and leg swelling.  Gastrointestinal: Negative for nausea and vomiting.  Genitourinary: Negative for dysuria.  Musculoskeletal: Negative for joint swelling.  Skin: Negative for rash.  Neurological: Negative for headaches.  Hematological: Does not bruise/bleed easily.  Psychiatric/Behavioral: Negative for dysphoric mood. The patient is not nervous/anxious.     Past Medical History:  Diagnosis Date  . Arthritis   . Asthma   . Astigmatism with presbyopia   . Benign neoplasm of right choroid   . Choroidal nevus, right   . Colon cancer ([place])   . Colon neoplasm   . Colon polyp   . Diverticulitis   . Hemorrhoids   . History of kidney stones    pmh  . Hx of gallstones    had chole  . Hyperlipidemia   . Intermediate stage dry age-related macular degeneration of  both eyes   . Mediastinal lymphadenopathy   . Posterior capsular opacification non visually significant of left eye   . Pseudophakia of both eyes   . Pulmonary nodule   . PVD (posterior vitreous detachment), both eyes   . Sleep apnea      Family History  Problem Relation Age of Onset  . Arthritis Father   . Hypertension Father    . Macular degeneration Father   . Macular degeneration Sister   . Diabetes Sister   . Cataracts Sister   . Hypertension Brother   . Diabetes Brother   . Macular degeneration Maternal Aunt      Social History   Socioeconomic History  . Marital status: Widowed    Spouse name: Not on file  . Number of children: Not on file  . Years of education: Not on file  . Highest education level: Not on file  Occupational History  . Not on file  Social Needs  . Financial resource strain: Not on file  . Food insecurity:    Worry: Not on file    Inability: Not on file  . Transportation needs:    Medical: Not on file    Non-medical: Not on file  Tobacco Use  . Smoking status: Former Smoker    Packs/day: 1.00    Years: 40.00    Pack years: 40.00    Types: Cigarettes    Last attempt to quit: 11/14/2003    Years since quitting: 14.3  . Smokeless tobacco: Never Used  Substance and Sexual Activity  . Alcohol use: No    Frequency: Never  . Drug use: No  . Sexual activity: Not on file  Lifestyle  . Physical activity:    Days per week: Not on file    Minutes per session: Not on file  . Stress: Not on file  Relationships  . Social connections:    Talks on phone: Not on file    Gets together: Not on file    Attends religious service: Not on file    Active member of club or organization: Not on file    Attends meetings of clubs or organizations: Not on file    Relationship status: Not on file  . Intimate partner violence:    Fear of current or ex partner: Not on file    Emotionally abused: Not on file    Physically abused: Not on file    Forced sexual activity: Not on file  Other Topics Concern  . Not on file  Social History Narrative  . Not on file     Allergies  Allergen Reactions  . Actonel [Risedronate Sodium] Swelling  . Drixoral Allergy Sinus [Dexbromphen-Pse-Apap Er] Swelling  . Nsaids Diarrhea    Bloody stool  . Other     No seeds, nuts, or husk due to  diverticulitis     Outpatient Medications Prior to Visit  Medication Sig Dispense Refill  . albuterol (PROVENTIL HFA;VENTOLIN HFA) 108 (90 Base) MCG/ACT inhaler Inhale 2 puffs into the lungs every 6 (six) hours as needed for wheezing or shortness of breath. 1 Inhaler 2  . aspirin EC 81 MG tablet Take 81 mg by mouth daily.    . Calcium Carb-Cholecalciferol (CALCIUM 600 + D PO) Take 1 tablet by mouth daily.    . Carboxymethylcellul-Glycerin (LUBRICATING EYE DROPS OP) Apply 1 drop to eye daily as needed (dry eyes).    . cetirizine (ZYRTEC) 10 MG tablet Take 10 mg by mouth daily as  needed for allergies.     . Cholecalciferol (VITAMIN D3) 1000 units CAPS Take 1,000 Units by mouth daily.     . Emollient (UDDERLY SMOOTH) CREA Apply 1 application topically daily as needed (dry skin).    . Flaxseed, Linseed, (FLAXSEED OIL) 1000 MG CAPS Take 1,000 mg by mouth daily.     Marland Kitchen KRILL OIL PO Take 1 capsule by mouth daily.    . mometasone-formoterol (DULERA) 200-5 MCG/ACT AERO Inhale 2 puffs into the lungs 2 (two) times daily. 1 Inhaler 12  . mometasone-formoterol (DULERA) 200-5 MCG/ACT AERO Inhale 2 puffs into the lungs 2 (two) times daily. 1 Inhaler 0  . Multiple Vitamin (MULTIVITAMIN WITH MINERALS) TABS tablet Take 1 tablet by mouth daily.    . Multiple Vitamins-Minerals (PRESERVISION AREDS 2 PO) Take 1 tablet by mouth 2 (two) times daily.     . pravastatin (PRAVACHOL) 80 MG tablet Take 80 mg by mouth daily.    . rivaroxaban (XARELTO) 20 MG TABS tablet Take 20 mg by mouth daily with supper.    . triamterene-hydrochlorothiazide (MAXZIDE) 75-50 MG tablet Take 0.5 tablets by mouth daily as needed (for swelling).      No facility-administered medications prior to visit.         Objective:   Physical Exam Vitals:   04/01/18 1611  BP: 132/88  Pulse: (!) 107  SpO2: 93%  Weight: 247 lb 6.4 oz (112.2 kg)  Height: 5\' 2"  (1.575 m)   Gen: Pleasant, obese woman, in no distress,  normal affect  ENT: No  lesions,  mouth clear,  oropharynx clear, no postnasal drip  Neck: No JVD, no stridor  Lungs: No use of accessory muscles, no crackles or wheezes  Cardiovascular: RRR, heart sounds normal, no murmur or gallops, no peripheral edema  Musculoskeletal: No deformities, no cyanosis or clubbing  Neuro: alert, non focal  Skin: Warm, no lesions or rashes     Assessment & Plan:  Obstructive sleep apnea On CPAP 8.  I am interested in changing her to an auto titration mode to see if she may need additional pressure to get adequate therapy.  I will change her to auto titration 8 to 20 cm water  Asthma Based on pulmonary function testing from December 2018.  She did benefit from [facility].  I like to try changing her to Anoro to see if she will derive more benefit.  Dyspnea Progressive dyspnea over the last several months.  Certainly her chemotherapy could be a contributor, her recent port infection could be a contributor.  Her most recent PET scan did not show any evidence of evolving pneumonitis, interstitial disease.  If so I would expect her to begin to recover.  She is at risk for pulmonary embolism but has been on Xarelto for upper extremity DVT.  She has obstructive lung disease that may be undertreated and I will try altering her bronchodilator regimen.  Walking oximetry today to ensure that she does not desaturate.  Baltazar Apo, MD, PhD 04/01/2018, 5:12 PM [facility] [phone] or if no answer [phone]

## 2018-04-02 ENCOUNTER — Telehealth: Payer: Self-pay | Admitting: Emergency Medicine

## 2018-04-02 DIAGNOSIS — G4733 Obstructive sleep apnea (adult) (pediatric): Secondary | ICD-10-CM

## 2018-04-02 NOTE — Telephone Encounter (Signed)
Called and spoke with patient, she states that she uses [employer] [employer] and not [facility].   Called and spoke with Barbaraann Rondo and advised him that the order could be cancelled as it was sent to the wrong place.   Order sent to correct DME company.

## 2018-04-23 ENCOUNTER — Telehealth: Payer: Self-pay | Admitting: Emergency Medicine

## 2018-04-23 NOTE — Telephone Encounter (Signed)
Called and spoke with patient advised her that we placed a sample up front. Nothing further needed.

## 2018-05-01 ENCOUNTER — Telehealth: Payer: Self-pay | Admitting: Emergency Medicine

## 2018-05-01 MED ORDER — UMECLIDINIUM-VILANTEROL 62.5-25 MCG/INH IN AEPB: 1.0000 | INHALATION_SPRAY | Freq: Every day | RESPIRATORY_TRACT | 0 refills | Status: AC

## 2018-05-01 NOTE — Telephone Encounter (Signed)
Called and spoke to pt.  Pt is requesting a sample of anoro to get her by until her OV, as she feels this is effective. One sample of Anoro has been placed up front for pick up. Nothing further is needed.

## 2018-05-10 ENCOUNTER — Encounter: Payer: Self-pay | Admitting: Emergency Medicine

## 2018-05-10 ENCOUNTER — Telehealth: Payer: Self-pay

## 2018-05-10 ENCOUNTER — Ambulatory Visit (INDEPENDENT_AMBULATORY_CARE_PROVIDER_SITE_OTHER): Payer: Medicare HMO | Admitting: Emergency Medicine

## 2018-05-10 DIAGNOSIS — G4733 Obstructive sleep apnea (adult) (pediatric): Secondary | ICD-10-CM

## 2018-05-10 DIAGNOSIS — J45909 Unspecified asthma, uncomplicated: Secondary | ICD-10-CM

## 2018-05-10 MED ORDER — UMECLIDINIUM-VILANTEROL 62.5-25 MCG/INH IN AEPB: 1.0000 | INHALATION_SPRAY | Freq: Every day | RESPIRATORY_TRACT | 5 refills | Status: DC

## 2018-05-10 NOTE — Patient Instructions (Signed)
Based on the improvement in your breathing we will plan to continue Anoro 1 inhalation once a day. Keep your albuterol available to use 2 puffs if needed for shortness of breath, chest tightness, wheezing. We will contact [place] regarding their prescription assistance program.  We will also contact [place] to assess their assistance program as well. Continue CPAP every night. Follow with oncology as planned. Follow with Dr Lamonte Sakai in 6 months or sooner if you have any problems

## 2018-05-10 NOTE — Progress Notes (Signed)
Subjective:    Patient ID: Natasha Peters, female    DOB: Jan 22, 1951, 67 y.o.   MRN: [id number]  HPI 67 year old former smoker (40 pack years) with a history of sleep apnea on CPAP, asthma that was made 3-4 years ago. She has Dulera that she uses prn. Uses zyrtec prn as well.   She has adenocarcinoma of the sigmoid colon status post sigmoid colectomy 09/2016, adjuvant chemotherapy completed 01/2017, managed by Dr Rudean Hitt.  1 out of 13 nodes positive for involvement.  Also with history of CLL.  She was noted to have a peripheral lingular pulmonary nodule on CT chest 02/2017.  Enlarged and more solid on repeat CT 09/24/17 scan that I have reviewed from 09/07/17.  This prompted a PET scan that was done on 09/24/17 that shows no abdominal hypermetabolism but confirmed hyper metabolic 1.7 cm solid lingular pulmonary nodule, enlarged and hypermetabolic subcarinal and right lower paratracheal mediastinal lymphadenopathy.  She saw Dr. Freda Munro with thoracic surgery, and is referred now for planning tissue diagnosis.  ROV 11/23/17 --Natasha Peters is 67 years old and has a history of colon cancer followed at [facility].  Also a history of CLL.  I saw her for a peripheral lingular nodule and mediastinal lymphadenopathy.  We performed a E bus and then transbronchial biopsies.  The nodal biopsies were consistent with metastatic colon adenocarcinoma.  The lingular biopsies were nondiagnostic.  She returns today for follow-up.  She discussed the results with Dr Rudean Hitt and he is concerned that the lingular nodule could still be unrelated to the mediastinal findings.  He asked for a needle biopsy of the lesion.  ROV 04/01/18 --67 year old woman with a history of colon cancer and CLL, OSA, obstruction on PFT.  She underwent endobronchial ultrasound and transbronchial biopsies for peripheral lingular nodule and mediastinal lymphadenopathy.  The mediastinum was consistent with metastatic colon adenocarcinoma. She underwent  further chemo x 4. Her port was removed due to infection 6 weeks ago.  She is referred back today for evaluation of progressive SOB. PET 03/26/18 >> no new ILD or pneumonitis, mediastinal LAD shrinking, lingular nodule stable. She is on [facility], has benefited. She has gained about 6 lbs. She is on xarelto for DVT associated with her port. She is complaint with CPAP, still naps during the day.   ROV 05/10/18 --67 year old woman who follows up today for dyspnea.  I seen her in the past for evaluation of a lingular nodule and mediastinal lymphadenopathy.  The ultimate diagnosis was metastatic colon cancer.  She has a history of this as well as CLL, OSA.  Her port has been removed due to DVT and infxn. Now off abx, on anticoag. She is starting Stivarga for her malignancy since she no longer has a port.   At her last visit we initiated Anoro, feels that is is helping her, breathing is better. No chest tightness. Occasional wheeze. Uses ventolin about 1-4x a week.   Good CPAP compliance. Feels that she is benefiting clinically.    Review of Systems  Constitutional: Negative for fever and unexpected weight change.  HENT: Negative for congestion, dental problem, ear pain, nosebleeds, postnasal drip, rhinorrhea, sinus pressure, sneezing, sore throat and trouble swallowing.   Eyes: Negative for redness and itching.  Respiratory: Negative for cough, chest tightness, shortness of breath and wheezing.   Cardiovascular: Negative for palpitations and leg swelling.  Gastrointestinal: Negative for nausea and vomiting.  Genitourinary: Negative for dysuria.  Musculoskeletal: Negative for joint swelling.  Skin: Negative for  rash.  Neurological: Negative for headaches.  Hematological: Does not bruise/bleed easily.  Psychiatric/Behavioral: Negative for dysphoric mood. The patient is not nervous/anxious.     Past Medical History:  Diagnosis Date  . Arthritis   . Asthma   . Astigmatism with presbyopia   . Benign  neoplasm of right choroid   . Choroidal nevus, right   . Colon cancer ([place])   . Colon neoplasm   . Colon polyp   . Diverticulitis   . Hemorrhoids   . History of kidney stones    pmh  . Hx of gallstones    had chole  . Hyperlipidemia   . 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 Mediastinal lymphadenopathy   . Posterior capsular opacification non visually significant of left eye   . Pseudophakia of both eyes   . Pulmonary nodule   . PVD (posterior vitreous detachment), both eyes   . Sleep apnea      Family History  Problem Relation Age of Onset  . Arthritis Father   . Hypertension Father   . Macular degeneration Father   . Macular degeneration Sister   . Diabetes Sister   . Cataracts Sister   . Hypertension Brother   . Diabetes Brother   . Macular degeneration Maternal Aunt      Social History   Socioeconomic History  . Marital status: Widowed    Spouse name: Not on file  . Number of children: Not on file  . Years of education: Not on file  . Highest education level: Not on file  Occupational History  . Not on file  Social Needs  . Financial resource strain: Not on file  . Food insecurity:    Worry: Not on file    Inability: Not on file  . Transportation needs:    Medical: Not on file    Non-medical: Not on file  Tobacco Use  . Smoking status: Former Smoker    Packs/day: 1.00    Years: 40.00    Pack years: 40.00    Types: Cigarettes    Last attempt to quit: 11/14/2003    Years since quitting: 14.4  . Smokeless tobacco: Never Used  Substance and Sexual Activity  . Alcohol use: No    Frequency: Never  . Drug use: No  . Sexual activity: Not on file  Lifestyle  . Physical activity:    Days per week: Not on file    Minutes per session: Not on file  . Stress: Not on file  Relationships  . Social connections:    Talks on phone: Not on file    Gets together: Not on file    Attends religious service: Not on file    Active  member of club or organization: Not on file    Attends meetings of clubs or organizations: Not on file    Relationship status: Not on file  . Intimate partner violence:    Fear of current or ex partner: Not on file    Emotionally abused: Not on file    Physically abused: Not on file    Forced sexual activity: Not on file  Other Topics Concern  . Not on file  Social History Narrative  . Not on file     Allergies  Allergen Reactions  . Actonel [Risedronate Sodium] Swelling  . Drixoral Allergy Sinus [Dexbromphen-Pse-Apap Er] Swelling  . Nsaids Diarrhea    Bloody stool  . Other     No  seeds, nuts, or husk due to diverticulitis     Outpatient Medications Prior to Visit  Medication Sig Dispense Refill  . albuterol (PROVENTIL HFA;VENTOLIN HFA) 108 (90 Base) MCG/ACT inhaler Inhale 2 puffs into the lungs every 6 (six) hours as needed for wheezing or shortness of breath. 1 Inhaler 2  . aspirin EC 81 MG tablet Take 81 mg by mouth daily.    . Calcium Carb-Cholecalciferol (CALCIUM 600 + D PO) Take 1 tablet by mouth daily.    . Carboxymethylcellul-Glycerin (LUBRICATING EYE DROPS OP) Apply 1 drop to eye daily as needed (dry eyes).    . cetirizine (ZYRTEC) 10 MG tablet Take 10 mg by mouth daily as needed for allergies.     . Cholecalciferol (VITAMIN D3) 1000 units CAPS Take 1,000 Units by mouth daily.     . Emollient (UDDERLY SMOOTH) CREA Apply 1 application topically daily as needed (dry skin).    . Flaxseed, Linseed, (FLAXSEED OIL) 1000 MG CAPS Take 1,000 mg by mouth daily.     Marland Kitchen KRILL OIL PO Take 1 capsule by mouth daily.    . Multiple Vitamin (MULTIVITAMIN WITH MINERALS) TABS tablet Take 1 tablet by mouth daily.    . Multiple Vitamins-Minerals (PRESERVISION AREDS 2 PO) Take 1 tablet by mouth 2 (two) times daily.     . pravastatin (PRAVACHOL) 80 MG tablet Take 80 mg by mouth daily.    . rivaroxaban (XARELTO) 20 MG TABS tablet Take 20 mg by mouth daily with supper.    . STIVARGA 40 MG tablet  Take 40 mg by mouth 4 (four) times daily.    Marland Kitchen triamterene-hydrochlorothiazide (MAXZIDE) 75-50 MG tablet Take 0.5 tablets by mouth daily as needed (for swelling).     Marland Kitchen umeclidinium-vilanterol (ANORO ELLIPTA) 62.5-25 MCG/INH AEPB Inhale 1 puff into the lungs daily. 28 each 0  . mometasone-formoterol (DULERA) 200-5 MCG/ACT AERO Inhale 2 puffs into the lungs 2 (two) times daily. 1 Inhaler 12  . mometasone-formoterol (DULERA) 200-5 MCG/ACT AERO Inhale 2 puffs into the lungs 2 (two) times daily. 1 Inhaler 0   No facility-administered medications prior to visit.         Objective:   Physical Exam Vitals:   05/10/18 1422 05/10/18 1423  BP:  118/78  Pulse:  (!) 110  SpO2:  93%  Weight: 249 lb (112.9 kg) 249 lb (112.9 kg)  Height: 5\' 2"  (1.575 m) 5\' 2"  (1.575 m)   Gen: Pleasant, obese woman, in no distress,  normal affect  ENT: No lesions,  mouth clear,  oropharynx clear, no postnasal drip  Neck: No JVD, no stridor  Lungs: No use of accessory muscles, no crackles or wheezes  Cardiovascular: RRR, heart sounds normal, no murmur or gallops, no peripheral edema  Musculoskeletal: No deformities, no cyanosis or clubbing  Neuro: alert, non focal  Skin: Warm, no lesions or rashes     Assessment & Plan:  Obstructive sleep apnea Good compliance and clinical benefit from her CPAP.  Continue same  Asthma Significant clinical benefit from the Anoro.  I like to keep her on this medication.  She may need financial assistance and we will attempt to get this through [place] and also we will pursue input from [facility].    [employer] > The phone number is [phone], extension [phone]. [facility]  Baltazar Apo, MD, PhD 05/10/2018, 2:50 PM [facility] [phone] or if no answer [phone]

## 2018-05-10 NOTE — Addendum Note (Signed)
Addended by: Jannette Spanner on: 05/10/2018 03:04 PM   Modules accepted: Orders

## 2018-05-10 NOTE — Assessment & Plan Note (Signed)
Good compliance and clinical benefit from her CPAP.  Continue same

## 2018-05-10 NOTE — Telephone Encounter (Signed)
Pt was seen by Dr. Lamonte Sakai today and wanted this information placed in her chart for medication assistance through [facility]. If there is ever a time where she is unable to afford a medication then we should contact [employer] at [phone] ext [phone]. She will help pt with medications.  I sent the Anoro to the pharmacy to see what the insurance will cover and pt stated she would call us back if it was too expensive. At that time we can contact [facility]. Will await her call.

## 2018-05-10 NOTE — Assessment & Plan Note (Signed)
Significant clinical benefit from the Anoro.  I like to keep her on this medication.  She may need financial assistance and we will attempt to get this through [place] and also we will pursue input from [facility].    [employer] > The phone number is [phone], extension [phone]. [employer]

## 2018-06-18 ENCOUNTER — Other Ambulatory Visit: Payer: Self-pay | Admitting: *Deleted

## 2018-06-18 MED ORDER — UMECLIDINIUM-VILANTEROL 62.5-25 MCG/INH IN AEPB: 1.0000 | INHALATION_SPRAY | Freq: Every day | RESPIRATORY_TRACT | 1 refills | Status: DC

## 2018-07-29 ENCOUNTER — Telehealth: Payer: Self-pay | Admitting: Emergency Medicine

## 2018-07-29 NOTE — Telephone Encounter (Signed)
Natasha Peters is returning call, CB is [phone] or [phone].

## 2018-07-29 NOTE — Telephone Encounter (Signed)
Called patient is aware and verbalized understanding nothing further needed at this time.

## 2018-07-29 NOTE — Telephone Encounter (Signed)
ATC pt's daughter, [name]. Unable to leave vm, as mailbox has not been set up.  Will call back.

## 2018-07-29 NOTE — Telephone Encounter (Signed)
Called and spoke to pt's daughter, Natasha Peters ([place]). Natasha Peters states pt to currently admitted at [facility] for blood infection and blocked bile duct.  Natasha Peters is wanting to know if Dr. Lamonte Sakai dx pt with COPD.  Pt is under the impression that she has been dx with COPD by Dr. Lamonte Sakai.  I have checked pt's chart, it does not appear that pt has dx of COPD.   RB please advise. Thanks

## 2018-07-29 NOTE — Telephone Encounter (Signed)
Please let her know that based on her pulmonary function testing and her improvement on Anoro, I believe she has mild to moderate COPD. Thanks.

## 2018-11-01 ENCOUNTER — Ambulatory Visit (INDEPENDENT_AMBULATORY_CARE_PROVIDER_SITE_OTHER): Payer: Medicare HMO | Admitting: Emergency Medicine

## 2018-11-01 ENCOUNTER — Encounter: Payer: Self-pay | Admitting: Emergency Medicine

## 2018-11-01 DIAGNOSIS — G4733 Obstructive sleep apnea (adult) (pediatric): Secondary | ICD-10-CM | POA: Diagnosis not present

## 2018-11-01 DIAGNOSIS — C189 Malignant neoplasm of colon, unspecified: Secondary | ICD-10-CM

## 2018-11-01 DIAGNOSIS — Z23 Encounter for immunization: Secondary | ICD-10-CM

## 2018-11-01 DIAGNOSIS — J45909 Unspecified asthma, uncomplicated: Secondary | ICD-10-CM | POA: Diagnosis not present

## 2018-11-01 DIAGNOSIS — J301 Allergic rhinitis due to pollen: Secondary | ICD-10-CM | POA: Diagnosis not present

## 2018-11-01 NOTE — Progress Notes (Signed)
Subjective:    Patient ID: Natasha Peters, female    DOB: [birth year], 67 y.o.   MRN: [id number]  HPI  ROV 05/10/18 --67 year old woman who follows up today for dyspnea.  I seen her in the past for evaluation of a lingular nodule and mediastinal lymphadenopathy.  The ultimate diagnosis was metastatic colon cancer.  She has a history of this as well as CLL, OSA.  Her port has been removed due to DVT and infxn. Now off abx, on anticoag. She is starting Stivarga for her malignancy since she no longer has a port.   At her last visit we initiated Anoro, feels that is is helping her, breathing is better. No chest tightness. Occasional wheeze. Uses ventolin about 1-4x a week.   Good CPAP compliance. Feels that she is benefiting clinically.   ROV 11/01/18 --this is a follow-up visit for 67 year old woman whom I met when we performed endobronchial ultrasound and transbronchial biopsies to diagnose metastatic colon cancer.  She also has a history of CLL, OSA on CPAP, DVT.  She has obstruction on pulmonary function testing consistent with asthmatic COPD and we have managed her with Anoro - continues to help her. Minimal albuterol use.  She reports today that she had to have ERCP for common duct stone. She is on Lonsurf for colon CA maintenance. She is having serial imaging at [facility].  Great CPAP compliance. She has good energy during the day. Occasionally naps. She has new equipment, in good repair. Wt has been stable. Using zyrtec prn.    Review of Systems  Constitutional: Negative for fever and unexpected weight change.  HENT: Negative for congestion, dental problem, ear pain, nosebleeds, postnasal drip, rhinorrhea, sinus pressure, sneezing, sore throat and trouble swallowing.   Eyes: Negative for redness and itching.  Respiratory: Negative for cough, chest tightness, shortness of breath and wheezing.   Cardiovascular: Negative for palpitations and leg swelling.  Gastrointestinal: Negative for nausea and  vomiting.  Genitourinary: Negative for dysuria.  Musculoskeletal: Negative for joint swelling.  Skin: Negative for rash.  Neurological: Negative for headaches.  Hematological: Does not bruise/bleed easily.  Psychiatric/Behavioral: Negative for dysphoric mood. The patient is not nervous/anxious.     Past Medical History:  Diagnosis Date  . Arthritis   . Asthma   . Astigmatism with presbyopia   . Benign neoplasm of right choroid   . Choroidal nevus, right   . Colon cancer ([place])   . Colon neoplasm   . Colon polyp   . Diverticulitis   . Hemorrhoids   . History of kidney stones    pmh  . Hx of gallstones    had chole  . Hyperlipidemia   . 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 Mediastinal lymphadenopathy   . Posterior capsular opacification non visually significant of left eye   . Pseudophakia of both eyes   . Pulmonary nodule   . PVD (posterior vitreous detachment), both eyes   . Sleep apnea      Family History  Problem Relation Age of Onset  . Arthritis Father   . Hypertension Father   . Macular degeneration Father   . Macular degeneration Sister   . Diabetes Sister   . Cataracts Sister   . Hypertension Brother   . Diabetes Brother   . Macular degeneration Maternal Aunt      Social History   Socioeconomic History  . Marital status: Widowed    Spouse name: Not on file  .  Number of children: Not on file  . Years of education: Not on file  . Highest education level: Not on file  Occupational History  . Not on file  Social Needs  . Financial resource strain: Not on file  . Food insecurity:    Worry: Not on file    Inability: Not on file  . Transportation needs:    Medical: Not on file    Non-medical: Not on file  Tobacco Use  . Smoking status: Former Smoker    Packs/day: 1.00    Years: 40.00    Pack years: 40.00    Types: Cigarettes    Last attempt to quit: 11/14/2003    Years since quitting: 14.9  . Smokeless tobacco:  Never Used  Substance and Sexual Activity  . Alcohol use: No    Frequency: Never  . Drug use: No  . Sexual activity: Not on file  Lifestyle  . Physical activity:    Days per week: Not on file    Minutes per session: Not on file  . Stress: Not on file  Relationships  . Social connections:    Talks on phone: Not on file    Gets together: Not on file    Attends religious service: Not on file    Active member of club or organization: Not on file    Attends meetings of clubs or organizations: Not on file    Relationship status: Not on file  . Intimate partner violence:    Fear of current or ex partner: Not on file    Emotionally abused: Not on file    Physically abused: Not on file    Forced sexual activity: Not on file  Other Topics Concern  . Not on file  Social History Narrative  . Not on file     Allergies  Allergen Reactions  . Actonel [Risedronate Sodium] Swelling  . Drixoral Allergy Sinus [Dexbromphen-Pse-Apap Er] Swelling  . Nsaids Diarrhea    Bloody stool  . Other     No seeds, nuts, or husk due to diverticulitis     Outpatient Medications Prior to Visit  Medication Sig Dispense Refill  . albuterol (PROVENTIL HFA;VENTOLIN HFA) 108 (90 Base) MCG/ACT inhaler Inhale 2 puffs into the lungs every 6 (six) hours as needed for wheezing or shortness of breath. 1 Inhaler 2  . aspirin EC 81 MG tablet Take 81 mg by mouth daily.    . Carboxymethylcellul-Glycerin (LUBRICATING EYE DROPS OP) Apply 1 drop to eye daily as needed (dry eyes).    . cetirizine (ZYRTEC) 10 MG tablet Take 10 mg by mouth daily as needed for allergies.     . Cholecalciferol (VITAMIN D3) 1000 units CAPS Take 1,000 Units by mouth daily.     . Emollient (UDDERLY SMOOTH) CREA Apply 1 application topically daily as needed (dry skin).    . Flaxseed, Linseed, (FLAXSEED OIL) 1000 MG CAPS Take 1,000 mg by mouth daily.     Marland Kitchen KRILL OIL PO Take 1 capsule by mouth daily.    . Multiple Vitamin (MULTIVITAMIN WITH  MINERALS) TABS tablet Take 1 tablet by mouth daily.    . Multiple Vitamins-Minerals (PRESERVISION AREDS 2 PO) Take 1 tablet by mouth 2 (two) times daily.     . rivaroxaban (XARELTO) 20 MG TABS tablet Take 20 mg by mouth daily with supper.    . triamterene-hydrochlorothiazide (MAXZIDE) 75-50 MG tablet Take 0.5 tablets by mouth daily as needed (for swelling).     Marland Kitchen trifluridine-tipiracil (  LONSURF) 15-6.14 MG tablet Take by mouth.    . umeclidinium-vilanterol (ANORO ELLIPTA) 62.5-25 MCG/INH AEPB Inhale 1 puff into the lungs daily. 180 each 1  . Calcium Carb-Cholecalciferol (CALCIUM 600 + D PO) Take 1 tablet by mouth daily.    . pravastatin (PRAVACHOL) 80 MG tablet Take 80 mg by mouth daily.    . STIVARGA 40 MG tablet Take 40 mg by mouth 4 (four) times daily.     No facility-administered medications prior to visit.         Objective:   Physical Exam Vitals:   11/01/18 1115  BP: 110/78  Pulse: 91  SpO2: 96%  Weight: 243 lb 6.4 oz (110.4 kg)  Height: _0  (1.575 m)   Gen: Pleasant, obese woman, in no distress,  normal affect  ENT: No lesions,  mouth clear,  oropharynx clear, no postnasal drip  Neck: No JVD, no stridor  Lungs: No use of accessory muscles, no crackles or wheezes  Cardiovascular: RRR, heart sounds normal, no murmur or gallops, no peripheral edema  Musculoskeletal: No deformities, no cyanosis or clubbing  Neuro: alert, non focal  Skin: Warm, no lesions or rashes     Assessment & Plan:  Obstructive sleep apnea Good compliance with CPAP.  No problems noted, equipment is in good repair.  Good clinical benefit with better daytime wakefulness and energy.  Continue same  Asthma COPD with asthma features.  She is benefiting from Anoro, albuterol use is decreased significantly.  We will continue this regimen  Allergic rhinitis Continue Zyrtec during the Fall, Spring  Adenocarcinoma of colon [facility]) Currently managed on Lonsurf, which can cause interstitial lung dz.  Will need to continue surveillance, should be able to assess on her next PET scan at [place], [place], PhD 11/01/2018, 11:40 AM [facility] [phone] or if no answer [phone]

## 2018-11-01 NOTE — Patient Instructions (Addendum)
Please continue Anoro once daily. Keep your albuterol available to use 2 puffs if needed for shortness of breath, chest tightness, wheezing. Please continue your CPAP every night as you have been using it. Use your Zyrtec once daily during the allergy season Follow-up with oncology at [facility] as planned, repeat imaging there as planned.  We will need to take care to make sure there is not any evidence of progressive lung inflammation while you are on Lonsurf. Flu shot today Follow with Dr Lamonte Sakai in 6 months or sooner if you have any problems

## 2018-11-01 NOTE — Assessment & Plan Note (Signed)
Good compliance with CPAP.  No problems noted, equipment is in good repair.  Good clinical benefit with better daytime wakefulness and energy.  Continue same

## 2018-11-01 NOTE — Assessment & Plan Note (Signed)
Currently managed on Lonsurf, which can cause interstitial lung dz. Will need to continue surveillance, should be able to assess on her next PET scan at [facility]

## 2018-11-01 NOTE — Assessment & Plan Note (Signed)
COPD with asthma features.  She is benefiting from Anoro, albuterol use is decreased significantly.  We will continue this regimen

## 2018-11-01 NOTE — Assessment & Plan Note (Signed)
Continue Zyrtec during the Fall, Spring

## 2018-11-07 ENCOUNTER — Other Ambulatory Visit: Payer: Self-pay | Admitting: Emergency Medicine

## 2019-03-21 ENCOUNTER — Other Ambulatory Visit: Payer: Self-pay | Admitting: Emergency Medicine

## 2019-04-28 ENCOUNTER — Telehealth: Payer: Self-pay | Admitting: Emergency Medicine

## 2019-04-28 MED ORDER — PREDNISONE 10 MG PO TABS: ORAL_TABLET | ORAL | 0 refills | Status: DC

## 2019-04-28 NOTE — Telephone Encounter (Signed)
Sound likes she may need additional prednisone course  How much prednisone was she given? Was it a taper, when did it stop?

## 2019-04-28 NOTE — Telephone Encounter (Signed)
Primary Pulmonologist: RB Last office visit and with whom: 11/01/18 with RB What do we see them for (pulmonary problems): OSA/asthma w/ copd Last OV assessment/plan: Instructions  Please continue Anoro once daily. Keep your albuterol available to use 2 puffs if needed for shortness of breath, chest tightness, wheezing. Please continue your CPAP every night as you have been using it. Use your Zyrtec once daily during the allergy season Follow-up with oncology at [facility] as planned, repeat imaging there as planned.  We will need to take care to make sure there is not any evidence of progressive lung inflammation while you are on Lonsurf. Flu shot today Follow with Dr Lamonte Sakai in 6 months or sooner if you have any problems     Was appointment offered to patient (explain)?  Pt wants recommendations   Reason for call: Called and spoke with pt who stated she went to urgent care June 3 and stated she was told she had bronchitis and was prescribed levofloxacin. Pt stated her symptoms had begun a week prior to that. Pt had COVID test performed and results came back negative and after negative results, pt was prescribed prednisone 6/4 once the COVID test results came back negative.  Pt stated she is still coughing some but her main complaints is that she is becoming SOB just walking from one room to the other. Pt states that her O2 sats have been stable when she has been to the doctor recently.   Pt has had complaints of wheezing.  Pt denies any complaints of fever, body aches, or chills. Pt also denies any current complaints of chest tightness.  Pt is using her anoro as prescribed. Pt has had to use her rescue inhaler about every 4 hours to help with her breathing.  Pt is wanting to know what we recommend to help with her symptoms. Beth, please advise on this for pt. Thanks!

## 2019-04-28 NOTE — Telephone Encounter (Signed)
I'll send in a longer taper. Have her follow up in office with RB, he has been adding on days

## 2019-04-28 NOTE — Telephone Encounter (Signed)
Returned call to patient.  Scheduled OV with Dr. Lamonte Sakai on 05/05/29 at 12noon. Advised of no visitor policy and temp at door (dont drink hot or cold liquids 15 min before arrival).  Also informed patient that additional prednisone has been sent to pharmacy. Continue prednisone until she sees Dr. Lamonte Sakai.  Patient acknowledged understanding and had no further questions.  Advised to contact us or go to ED if symptoms worsen.  Nothing further needed.

## 2019-04-28 NOTE — Telephone Encounter (Signed)
Called and spoke with pt. Pt stated when she was on the prednisone taper where she took 6tabs on day 1, then 5tabs on day 2, 4tabs day 3, 3tabs day 4, 2tabs day 5, and 1tab day 6.   Pt began the prednisone 6/4 and finished the prednisone 6/9.

## 2019-05-02 ENCOUNTER — Telehealth: Payer: Self-pay | Admitting: Emergency Medicine

## 2019-05-02 NOTE — Telephone Encounter (Signed)
I do not have the films available but have reviewed the report.  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  This is almost certainly due to her metastatic colon cancer.  Her lingular nodule is slightly larger as well.  I think will be reasonable for the patient to go straight to the cardiothoracic surgeon because based on the description it sounds like she may need some sort of definitive therapy to protect her from airway blockage.  This might include surgical removal, laser or cryotherapy debridement via bronchoscopy.  While I could certainly inspect the airways and get a tissue diagnosis, I would not be able to do definitive therapy.  She would have to be referred out to thoracic surgery or interventional pulmonology for that.  For this reason I think she should keep the thoracic surgery appointment.

## 2019-05-02 NOTE — Telephone Encounter (Signed)
LMTCB for [name], pt's daughter (on [place]).

## 2019-05-02 NOTE — Telephone Encounter (Signed)
Spoke with [name], she states that pt has a 2.1 cm nodule in her bronchus and [facility] wants her to see a cardiothoracic surgeon there at [facility] but she wanted to speak to Dr. Lamonte Sakai first because ethey have a history with him. I advised her that it would be up to them, either way would be fine but she wanted to run this by you first. I couldn't see the CT scan in [place] but she sent copies of the report through her mychart account for you to review. I will route to you. This is a time sensitive because the patient is scheduled to see RB on Tuesday and then the [facility] doctor on Thursday and she needs to make a decision because they cant go to both. RB please advise.   Dr. cardioThoracic surgeon

## 2019-05-02 NOTE — Telephone Encounter (Signed)
See phone message from today 04/01/2019

## 2019-05-05 NOTE — Telephone Encounter (Signed)
I sent pt's daughter a Deloris Ping message regarding this. She sent a mychart message as well. Will close encounter.

## 2019-05-06 ENCOUNTER — Ambulatory Visit: Payer: Medicare HMO | Admitting: Emergency Medicine

## 2019-08-22 ENCOUNTER — Other Ambulatory Visit: Payer: Self-pay | Admitting: Emergency Medicine

## 2019-09-19 ENCOUNTER — Telehealth: Payer: Self-pay | Admitting: Emergency Medicine

## 2019-09-19 ENCOUNTER — Ambulatory Visit (INDEPENDENT_AMBULATORY_CARE_PROVIDER_SITE_OTHER): Payer: Medicare HMO | Admitting: Pulmonary Disease

## 2019-09-19 ENCOUNTER — Encounter: Payer: Self-pay | Admitting: Pulmonary Disease

## 2019-09-19 DIAGNOSIS — C189 Malignant neoplasm of colon, unspecified: Secondary | ICD-10-CM

## 2019-09-19 DIAGNOSIS — G4733 Obstructive sleep apnea (adult) (pediatric): Secondary | ICD-10-CM | POA: Diagnosis not present

## 2019-09-19 DIAGNOSIS — R918 Other nonspecific abnormal finding of lung field: Secondary | ICD-10-CM | POA: Insufficient documentation

## 2019-09-19 DIAGNOSIS — J301 Allergic rhinitis due to pollen: Secondary | ICD-10-CM | POA: Diagnosis not present

## 2019-09-19 DIAGNOSIS — Z79899 Other long term (current) drug therapy: Secondary | ICD-10-CM

## 2019-09-19 DIAGNOSIS — J449 Chronic obstructive pulmonary disease, unspecified: Secondary | ICD-10-CM

## 2019-09-19 MED ORDER — PREDNISONE 10 MG PO TABS
ORAL_TABLET | ORAL | 0 refills | Status: DC
Start: 2019-09-19 — End: 2019-10-03

## 2019-09-19 MED ORDER — DOXYCYCLINE HYCLATE 100 MG PO TABS: 100.0000 mg | ORAL_TABLET | Freq: Two times a day (BID) | ORAL | 0 refills | Status: DC

## 2019-09-19 MED ORDER — FLUTICASONE PROPIONATE 50 MCG/ACT NA SUSP: 1.0000 | Freq: Every day | NASAL | 3 refills | Status: AC

## 2019-09-19 NOTE — Patient Instructions (Addendum)
You were seen today by Lauraine Rinne, NP  for:   1. Asthma-COPD overlap syndrome (HCC)  - doxycycline (VIBRA-TABS) 100 MG tablet; Take 1 tablet (100 mg total) by mouth 2 (two) times daily.  Dispense: 14 tablet; Refill: 0 - predniSONE (DELTASONE) 10 MG tablet; 4 tabs for 2 days, then 3 tabs for 2 days, 2 tabs for 2 days, then 1 tab for 2 days, then stop  Dispense: 20 tablet; Refill: 0  Anoro Ellipta  >>> Take 1 puff daily in the morning right when you wake up >>>Rinse your mouth out after use >>>This is a daily maintenance inhaler, NOT a rescue inhaler >>>Contact our office if you are having difficulties affording or obtaining this medication >>>It is important for you to be able to take this daily and not miss any doses    Only use your albuterol as a rescue medication to be used if you can't catch your breath by resting or doing a relaxed purse lip breathing pattern.  - The less you use it, the better it will work when you need it. - Ok to use up to 2 puffs  every 4 hours if you must but call for immediate appointment if use goes up over your usual need - Don't leave home without it !!  (think of it like the spare tire for your car)    2. Obstructive sleep apnea  We recommend that you continue using your CPAP daily >>>Keep up the hard work using your device >>> Goal should be wearing this for the entire night that you are sleeping, at least 4 to 6 hours  Remember:  . Do not drive or operate heavy machinery if tired or drowsy.  . Please notify the supply company and office if you are unable to use your device regularly due to missing supplies or machine being broken.  . Work on maintaining a healthy weight and following your recommended nutrition plan  . Maintain proper daily exercise and movement  . Maintaining proper use of your device can also help improve management of other chronic illnesses such as: Blood pressure, blood sugars, and weight management.   BiPAP/ CPAP Cleaning:   >>>Clean weekly, with Dawn soap, and bottle brush.  Set up to air dry.   3. Adenocarcinoma of colon [facility])  Continue follow-up with [facility] oncology Continue forward with planned CT imaging by [facility] oncology  4. Allergic rhinitis due to pollen, unspecified seasonality  Continue Zyrtec daily  - fluticasone (FLONASE) 50 MCG/ACT nasal spray; Place 1 spray into both nostrils daily.  Dispense: 16 g; Refill: 3  5. Medication management  Appointment needs to be made with clinical pharmacy team to help with medication access due to high cost of Anoro Ellipta  6. Abnormal findings on diagnostic imaging of lung  Continue forward with [facility] oncology CT imaging planned in November/2020   We recommend today:  No orders of the defined types were placed in this encounter.  No orders of the defined types were placed in this encounter.  Meds ordered this encounter  Medications  . doxycycline (VIBRA-TABS) 100 MG tablet    Sig: Take 1 tablet (100 mg total) by mouth 2 (two) times daily.    Dispense:  14 tablet    Refill:  0  . predniSONE (DELTASONE) 10 MG tablet    Sig: 4 tabs for 2 days, then 3 tabs for 2 days, 2 tabs for 2 days, then 1 tab for 2 days, then  stop    Dispense:  20 tablet    Refill:  0  . fluticasone (FLONASE) 50 MCG/ACT nasal spray    Sig: Place 1 spray into both nostrils daily.    Dispense:  16 g    Refill:  3    Follow Up:    Return in about 4 weeks (around 10/17/2019), or if symptoms worsen or fail to improve, for Follow up with Wyn Quaker FNP-C, Follow up with Dr. Lamonte Sakai.   Please do your part to reduce the spread of COVID-19:      Reduce your risk of any infection  and COVID19 by using the similar precautions used for avoiding the common cold or flu:  Marland Kitchen Wash your hands often with soap and warm water for at least 20 seconds.  If soap and water are not readily available, use an alcohol-based hand sanitizer with at least 60% alcohol.  . If coughing  or sneezing, cover your mouth and nose by coughing or sneezing into the elbow areas of your shirt or coat, into a tissue or into your sleeve (not your hands). Langley Gauss A MASK when in public  . Avoid shaking hands with others and consider head nods or verbal greetings only. . Avoid touching your eyes, nose, or mouth with unwashed hands.  . Avoid close contact with people who are sick. . Avoid places or events with large numbers of people in one location, like concerts or sporting events. . If you have some symptoms but not all symptoms, continue to monitor at home and seek medical attention if your symptoms worsen. . If you are having a medical emergency, call 911.   [place] / e-Visit: [email or web address]         [facility]: [facility]: [id number]                   [facility]: [id number]     It is flu season:   >>> Best ways to protect herself from the flu: Receive the yearly flu vaccine, practice good hand hygiene washing with soap and also using hand sanitizer when available, eat a nutritious meals, get adequate rest, hydrate appropriately   Please contact the office if your symptoms worsen or you have concerns that you are not improving.   Thank you for choosing [facility] for your healthcare, and for allowing [place] to partner with you on your healthcare journey. I am thankful to be able to provide care to you today.   Wyn Quaker FNP-C     Chronic Obstructive Pulmonary Disease Exacerbation Chronic obstructive pulmonary disease (COPD) is a long-term (chronic) lung problem. In COPD, the flow of air from the lungs is limited. COPD exacerbations are times that breathing gets worse and you need more than your normal treatment. Without treatment, they can be life threatening. If they happen often, your lungs can become  more damaged. If your COPD gets worse, your doctor may treat you with:  Medicines.  Oxygen.  Different ways to clear your airway, such as using a mask. Follow these instructions at home: Medicines  Take over-the-counter and prescription medicines only as told by your doctor.  If you take an antibiotic or steroid medicine, do not stop taking the medicine even if you start to feel better.  Keep up with shots (vaccinations) as told by your doctor. Be sure to get a yearly (annual) flu shot. Lifestyle  Do not smoke. If you need help quitting, ask your doctor.  Eat healthy foods.  Exercise regularly.  Get plenty of sleep.  Avoid tobacco smoke and other things that can bother your lungs.  Wash your hands often with soap and water. This will help keep you from getting an infection. If you cannot use soap and water, use hand sanitizer.  During flu season, avoid areas that are crowded with people. General instructions  Drink enough fluid to keep your pee (urine) clear or pale yellow. Do not do this if your doctor has told you not to.  Use a cool mist machine (vaporizer).  If you use oxygen or a machine that turns medicine into a mist (nebulizer), continue to use it as told.  Follow all instructions for rehabilitation. These are steps you can take to make your body work better.  Keep all follow-up visits as told by your doctor. This is important. Contact a doctor if:  Your COPD symptoms get worse than normal. Get help right away if:  You are short of breath and it gets worse.  You have trouble talking.  You have chest pain.  You cough up blood.  You have a fever.  You keep throwing up (vomiting).  You feel weak or you pass out (faint).  You feel confused.  You are not able to sleep because of your symptoms.  You are not able to do daily activities. Summary  COPD exacerbations are times that breathing gets worse and you need more treatment than normal.  COPD  exacerbations can be very serious and may cause your lungs to become more damaged.  Do not smoke. If you need help quitting, ask your doctor.  Stay up-to-date on your shots. Get a flu shot every year. This information is not intended to replace advice given to you by your health care provider. Make sure you discuss any questions you have with your health care provider. Document Released: 10/19/2011 Document Revised: 10/12/2017 Document Reviewed: 12/04/2016 Elsevier Patient Education  2020 [employer].

## 2019-09-19 NOTE — Progress Notes (Signed)
Virtual Visit via Telephone Note  I connected with Natasha Peters on 09/19/19 at  2:00 PM EST by telephone and verified that I am speaking with the correct person using two identifiers.  Location: Patient: Home Provider: [facility] - [id number], [place], [place], [place] [address]   I discussed the limitations, risks, security and privacy concerns of performing an evaluation and management service by telephone and the availability of in person appointments. I also discussed with the patient that there may be a patient responsible charge related to this service. The patient expressed understanding and agreed to proceed.  Patient consented to consult via telephone: Yes People present and their role in pt care: Pt   History of Present Illness:  68 year old female former smoker followed in our office for adenocarcinoma and COPD with asthma  Past medical history: Followed by oncology for malignant neoplasm of sigmoid colon (managed on Lonsurf - [facility]), allergic rhinitis, OSA  Smoking history: Former smoker.  Quit 2005.  40-pack-year smoking history. Maintenance: Anoro Ellipta Patient of Dr. Lamonte Sakai  Chief complaint: Increased wheezing   68 year old female former smoker followed in our office for COPD.  Patient also history of adenocarcinoma.  Patient is followed by Dr. Lamonte Sakai.  Patient is maintained on Anoro Ellipta.  Patient contacted our office on 09/19/2019 reporting issues had increased shortness of breath and wheezing and this prompted her to be scheduled for a televisit.   Patient was last seen in our office in December/2019  Patient continues to follow-up with [facility] oncology.  She is scheduled upcoming CT imaging planned for 09/24/2019.  This will be a CT of her chest and abdomen with contrast. 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  She also had a bronchoscopy in June/2020  following a June CT of her chest.  Patient reports that for the last 3 to 4 weeks has had increased shortness of breath, wheezing as well as an occasional cough.  If she does cough it is productive.  She feels that her shortness of breath has worsened.  Patient also having increased nasal congestion and sinus drainage.  She reports this is not discolored.  She continues to be maintained on Anoro Ellipta.  Although she is having difficulty affording Anoro Ellipta as now her 90-day supply has gone from $131 for 90-day supply to over $300 for 90-day supply.  She still has 25 days left of Anoro Ellipta but she is unsure what she supposed to do because she cannot physically afford this.   Observations/Objective:  11/01/2017-chest x-ray-no pneumothorax or acute finding, mildly improved lung volumes, persistent atelectasis in bases  10/26/2017-pulmonary function test-FVC 2.03 (69% predicted), postbronchodilator ratio 84, postbronchodilator FEV1 1.68 (76% predicted), no positive bronchodilator response, DLCO 18.76 (81% predicted)  05/02/2019-CTA chest-negative for acute PE, 2.1 cm renal mass with compromise of the proximal left mainstem bronchus, consider bronchoscopy evaluation, 2.1 cm lingular nodule previously 1.9  Assessment and Plan:  Allergic rhinitis Plan: Continue Zyrtec Start Flonase 1 spray each nostril daily for the next 7 days, then can use daily as needed for nasal congestion  Asthma-COPD overlap syndrome ([place]) Plan: Doxycycline today Prednisone today Continue Anoro Ellipta Referral to clinical pharmacy team to help with access and explanation of cost of Anoro Ellipta increasing, sounds like patient is likely in coverage/donut hole Continue rescue inhaler as needed Continue Zyrtec daily Start Flonase 1 spray each nostril daily for  7 days then as needed  Adenocarcinoma of colon [facility]) Plan: Continue follow-up with [place] forward  with planned CT imaging as managed by their office  Abnormal findings on diagnostic imaging of lung Plan: Complete upcoming CT imaging as managed by [facility] oncology [facility]  Medication management Patient reports that 90-day supply of Anoro Ellipta earlier this year was around $131, this has now increased over $300  Sounds like patient is in coverage gap/donut hole  Plan: Patient to have 1 week televisit with clinical pharmacy team to help with explanation of inhaler cost Can provide samples if available to maintain patient till January/11/2019   Follow Up Instructions:  Return in about 4 weeks (around 10/17/2019), or if symptoms worsen or fail to improve, for Follow up with Wyn Quaker FNP-C.   I discussed the assessment and treatment plan with the patient. The patient was provided an opportunity to ask questions and all were answered. The patient agreed with the plan and demonstrated an understanding of the instructions.   The patient was advised to call back or seek an in-person evaluation if the symptoms worsen or if the condition fails to improve as anticipated.  I provided 25 minutes of non-face-to-face time during this encounter.   Lauraine Rinne, NP

## 2019-09-19 NOTE — Assessment & Plan Note (Signed)
Patient reports that 90-day supply of Anoro Ellipta earlier this year was around $131, this has now increased over $300  Sounds like patient is in coverage gap/donut hole  Plan: Patient to have 1 week televisit with clinical pharmacy team to help with explanation of inhaler cost Can provide samples if available to maintain patient till January/11/2019

## 2019-09-19 NOTE — Telephone Encounter (Signed)
Spoke with pt, c/o increased wheezing, sob, fatigue, sinus congestion, pnd Xfew weeks. Denies fever, chest pain, cough, mucus production.    Pt has not been taking anything to help with s/s.  Pt also wants to discuss alternatives to Anoro- having a difficult time affording this.    Pt scheduled for a televisit with Aaron Edelman today at 2:00 since pt has not been seen since 10/2018.

## 2019-09-19 NOTE — Assessment & Plan Note (Signed)
Plan: Continue follow-up with [place] [facility] forward with planned CT imaging as managed by their office

## 2019-09-19 NOTE — Assessment & Plan Note (Signed)
Plan: Continue Zyrtec Start Flonase 1 spray each nostril daily for the next 7 days, then can use daily as needed for nasal congestion

## 2019-09-19 NOTE — Assessment & Plan Note (Signed)
Plan: Complete upcoming CT imaging as managed by [facility]

## 2019-09-19 NOTE — Assessment & Plan Note (Signed)
Plan: Doxycycline today Prednisone today Continue Anoro Ellipta Referral to clinical pharmacy team to help with access and explanation of cost of Anoro Ellipta increasing, sounds like patient is likely in coverage/donut hole Continue rescue inhaler as needed Continue Zyrtec daily Start Flonase 1 spray each nostril daily for 7 days then as needed

## 2019-09-20 NOTE — Progress Notes (Signed)
Subjective:  Patient presents today to [facility] to see pharmacy team for financial assistance (referred by Wyn Quaker, nurse practitioner). Past medical history includes malignant neoplasm of sigmoid colon, allergic rhinitis, and OSA. She is a former smoker (quit in 2005; 40-pack-year smoking history). At last appt with Wyn Quaker (09/19/19), patient reported that 90-day supply of Anoro Ellipta earlier this year was around $131, which has now increased over $300. Frederik Schmidt ran test claim for LAMA/LABA options covered by insurance which showed Anoro costs $110.87, Bevespi costs $100.83, and Stiolto costs $110.78. Patient is in coverage gap.   Patient is contacted for initial appt via telephone. Patient states cost is a barrier to [employer] use. She currently has 22 days remaining on her Anoro Ellipta inhaler. She receives financial assistance to help afford her rivaroxaban and Lonsurf. Patient keeps detailed record of finances.   ED visits for COPD exacerbations within past year: 0 Hospitalizations for COPD exacerbations within past year: 0  COPD medications  Current: Anoro Ellipta once puff once daily, albuterol prn (usually about 1x per day; sometimes more if she is more active), doxycycline, prednisone, flonase   Tried in past: Dulera (unknown)  Patient reports adherence  CAT ASSESSMENT  Rank each of the following items on a scale of 0 to 5 (with 5 being most severe) Write a # 0-5 in each box  I never cough (0) > I cough all the time (5) 3  I have no phlegm (mucus) in my chest (0) > My chest is completely full of phlegm (mucus) (5) 2  My chest does not feel tight at all (0) > My chest feels very tight (5) 2  When I walk up a hill or one flight of stairs I am not breathless (0) > When I walk up a hill or one flight of stairs I am very breathless (5) 5  I am not limited doing any activities at home (0) > I am very limited doing activities at home (5) 4  I am confident leaving  my home despite my lung function (0) > I am not at all confident leaving my home because of my lung condition (5)  3  I sleep soundly (0) > I don't sleep soundly because of my lung condition (5) 3  I have lots of energy (0) > I have no energy at all (5) 4   Total CAT Score: 26  Most recent blood eosinophil count was 0.1 cells/microL taken on 09/19/2019 ([facility])   Objective: Allergies  Allergen Reactions  . Actonel [Risedronate Sodium] Swelling  . Drixoral Allergy Sinus [Dexbromphen-Pse-Apap Er] Swelling  . Nsaids Diarrhea    Bloody stool  . Other     No seeds, nuts, or husk due to diverticulitis    Outpatient Encounter Medications as of 09/22/2019  Medication Sig  . Acetaminophen (TYLENOL ARTHRITIS PAIN PO) Take by mouth.  Marland Kitchen albuterol (PROVENTIL HFA;VENTOLIN HFA) 108 (90 Base) MCG/ACT inhaler Inhale 2 puffs into the lungs every 6 (six) hours as needed for wheezing or shortness of breath.  Jearl Klinefelter ELLIPTA 62.5-25 MCG/INH AEPB INHALE 1 PUFF INTO THE LUNGS DAILY  . aspirin EC 81 MG tablet Take 81 mg by mouth daily.  . Carboxymethylcellul-Glycerin (LUBRICATING EYE DROPS OP) Apply 1 drop to eye daily as needed (dry eyes).  . cetirizine (ZYRTEC) 10 MG tablet Take 10 mg by mouth daily as needed for allergies.   . Cholecalciferol (VITAMIN D3) 1000 units CAPS Take 1,000 Units by mouth daily.   Marland Kitchen  doxycycline (VIBRA-TABS) 100 MG tablet Take 1 tablet (100 mg total) by mouth 2 (two) times daily.  . Flaxseed, Linseed, (FLAXSEED OIL) 1000 MG CAPS Take 1,000 mg by mouth daily.   . fluticasone (FLONASE) 50 MCG/ACT nasal spray Place 1 spray into both nostrils daily.  Marland Kitchen KRILL OIL PO Take 1 capsule by mouth daily.  . Multiple Vitamin (MULTIVITAMIN WITH MINERALS) TABS tablet Take 1 tablet by mouth daily.  . Multiple Vitamins-Minerals (PRESERVISION AREDS 2 PO) Take 1 tablet by mouth 2 (two) times daily.   . predniSONE (DELTASONE) 10 MG tablet 4 tabs for 2 days, then 3 tabs for 2 days, 2 tabs for 2 days,  then 1 tab for 2 days, then stop  . rivaroxaban (XARELTO) 20 MG TABS tablet Take 20 mg by mouth daily with supper.  . triamterene-hydrochlorothiazide (MAXZIDE) 75-50 MG tablet Take 0.5 tablets by mouth daily as needed (for swelling).   Marland Kitchen trifluridine-tipiracil (LONSURF) 15-6.14 MG tablet Take by mouth.   No facility-administered encounter medications on file as of 09/22/2019.      Immunization History  Administered Date(s) Administered  . Influenza, High Dose Seasonal PF 08/02/2015, 09/10/2017, 11/01/2018  . Influenza,inj,Quad PF,6+ Mos 08/07/2016  . Influenza-Unspecified 07/09/2013, 07/14/2014, 08/02/2015, 08/07/2016  . Pneumococcal Conjugate-13 01/31/2016  . Pneumococcal Polysaccharide-23 07/12/2009, 02/20/2017  . Tdap 01/02/2011, 01/02/2011, 12/09/2012, 12/09/2012  . Zoster 01/11/2012  . Zoster Recombinat (Shingrix) 01/11/2012     PFTs 10/26/2017-pulmonary function test-FVC 2.03 (69% predicted), postbronchodilator ratio 84, postbronchodilator FEV1 1.68 (76% predicted), no positive bronchodilator response, DLCO 18.76 (81% predicted)  Assessment and Plan:  1. Financial Assistance (Anoro Ellipta via [place]) -[place] qualifications: (annual income: 1<31,899 and 2<$43,100; $600 OOP)  -Patient specific information: household of 1 with an annual income of $20,021 and spends $1062.54 on prescriptions annually.  Plan: mail patient [place] application to fill out and to provide copies of financial documentation (income and OOP prescription expenses). Have patient return application and financial documents at f/u appt with Wyn Quaker (10/03/2019). Fax application to [place] and update patient on status of application.   Other patient assistance program options for future reference. [place] Eke Aerosphere Tier 4: $95/30 day supply; $275/90 day supply [place]: (annual income: 1<35,000 and 2<$48,000; LIS; 3% of annual income)  -Stiolto Respimat Tier 3: $45/30 day supply; $125/90 day supply [place]ICares (annual  income: 1<25,520 and 2<$34,480)   2. [facility])  Patient states she has difficulties affording multiple medications. Advised patient it may be financially optimal to consider choosing a new insurance plan. Patient is unsure at this time. Discussed with pt mailing her information if she changes her mind for future reference. Pt verbalized understanding.   Thank you for involving pharmacy to assist in providing Ms. Neal's care.   Drexel Iha, PharmD PGY2 Ambulatory Care Pharmacy Resident

## 2019-09-22 ENCOUNTER — Ambulatory Visit (INDEPENDENT_AMBULATORY_CARE_PROVIDER_SITE_OTHER): Payer: Medicare HMO | Admitting: Pharmacist

## 2019-09-22 DIAGNOSIS — J449 Chronic obstructive pulmonary disease, unspecified: Secondary | ICD-10-CM

## 2019-10-03 ENCOUNTER — Other Ambulatory Visit: Payer: Self-pay

## 2019-10-03 ENCOUNTER — Ambulatory Visit: Payer: Medicare HMO | Admitting: Pulmonary Disease

## 2019-10-03 ENCOUNTER — Encounter: Payer: Self-pay | Admitting: Pulmonary Disease

## 2019-10-03 ENCOUNTER — Telehealth: Payer: Self-pay | Admitting: Pharmacy Technician

## 2019-10-03 VITALS — BP 120/72 | HR 89 | Temp 97.0°F | Ht 62.0 in | Wt 252.0 lb

## 2019-10-03 DIAGNOSIS — R918 Other nonspecific abnormal finding of lung field: Secondary | ICD-10-CM

## 2019-10-03 DIAGNOSIS — J301 Allergic rhinitis due to pollen: Secondary | ICD-10-CM | POA: Diagnosis not present

## 2019-10-03 DIAGNOSIS — J449 Chronic obstructive pulmonary disease, unspecified: Secondary | ICD-10-CM | POA: Diagnosis not present

## 2019-10-03 DIAGNOSIS — Z79899 Other long term (current) drug therapy: Secondary | ICD-10-CM | POA: Diagnosis not present

## 2019-10-03 DIAGNOSIS — G4733 Obstructive sleep apnea (adult) (pediatric): Secondary | ICD-10-CM

## 2019-10-03 MED ORDER — ANORO ELLIPTA 62.5-25 MCG/INH IN AEPB: 1.0000 | INHALATION_SPRAY | Freq: Every day | RESPIRATORY_TRACT | 0 refills | Status: DC

## 2019-10-03 MED ORDER — ANORO ELLIPTA 62.5-25 MCG/INH IN AEPB: INHALATION_SPRAY | RESPIRATORY_TRACT | 3 refills | Status: DC

## 2019-10-03 NOTE — Assessment & Plan Note (Signed)
Plan: Continue to work with clinical pharmacy team to qualify for [place] for you

## 2019-10-03 NOTE — Assessment & Plan Note (Signed)
Plan: Continue Anoro Ellipta Continue reviewed the clinical pharmacy team Continue Flonase Do nasal saline rinses twice daily

## 2019-10-03 NOTE — Patient Instructions (Addendum)
You were seen today by Natasha Rinne, NP  for:   1. Asthma-COPD overlap syndrome (HCC)  Anoro Ellipta  >>> Take 1 puff daily in the morning right when you wake up >>>Rinse your mouth out after use >>>This is a daily maintenance inhaler, NOT a rescue inhaler >>>Contact our office if you are having difficulties affording or obtaining this medication >>>It is important for you to be able to take this daily and not miss any doses    Only use your albuterol as a rescue medication to be used if you can't catch your breath by resting or doing a relaxed purse lip breathing pattern.  - The less you use it, the better it will work when you need it. - Ok to use up to 2 puffs  every 4 hours if you must but call for immediate appointment if use goes up over your usual need - Don't leave home without it !!  (think of it like the spare tire for your car)    2. Obstructive sleep apnea  We recommend that you continue using your CPAP daily >>>Keep up the hard work using your device >>> Goal should be wearing this for the entire night that you are sleeping, at least 4 to 6 hours  Remember:  . Do not drive or operate heavy machinery if tired or drowsy.  . Please notify the supply company and office if you are unable to use your device regularly due to missing supplies or machine being broken.  . Work on maintaining a healthy weight and following your recommended nutrition plan  . Maintain proper daily exercise and movement  . Maintaining proper use of your device can also help improve management of other chronic illnesses such as: Blood pressure, blood sugars, and weight management.   BiPAP/ CPAP Cleaning:  >>>Clean weekly, with Dawn soap, and bottle brush.  Set up to air dry.   3. Adenocarcinoma of colon [facility])  Continue follow-up with [facility] oncology  4. Allergic rhinitis due to pollen, unspecified seasonality  - fluticasone (FLONASE) 50 MCG/ACT nasal spray; Place 1 spray into both nostrils  daily.  Dispense: 16 g; Refill: 3  5. Medication management  6. Abnormal findings on diagnostic imaging of lung    We recommend today:  No orders of the defined types were placed in this encounter.  No orders of the defined types were placed in this encounter.  Meds ordered this encounter  Medications  . umeclidinium-vilanterol (ANORO ELLIPTA) 62.5-25 MCG/INH AEPB    Sig: INHALE 1 PUFF INTO THE LUNGS DAILY    Dispense:  180 each    Refill:  3  . umeclidinium-vilanterol (ANORO ELLIPTA) 62.5-25 MCG/INH AEPB    Sig: Inhale 1 puff into the lungs daily.    Dispense:  2 each    Refill:  0    Order Specific Question:   Lot Number?    Answer:   [id number]    Order Specific Question:   Expiration Date?    Answer:   02/10/2021    Order Specific Question:   Manufacturer?    Answer:   [employer] [[id number]]    Follow Up:    Return in about 2 months (around 12/03/2019), or if symptoms worsen or fail to improve, for Follow up with Dr. Lamonte Sakai.   Please do your part to reduce the spread of COVID-19:      Reduce your risk of any infection  and COVID19 by using the similar precautions used for avoiding  the common cold or flu:  Marland Kitchen Wash your hands often with soap and warm water for at least 20 seconds.  If soap and water are not readily available, use an alcohol-based hand sanitizer with at least 60% alcohol.  . If coughing or sneezing, cover your mouth and nose by coughing or sneezing into the elbow areas of your shirt or coat, into a tissue or into your sleeve (not your hands). Langley Gauss A MASK when in public  . Avoid shaking hands with others and consider head nods or verbal greetings only. . Avoid touching your eyes, nose, or mouth with unwashed hands.  . Avoid close contact with people who are sick. . Avoid places or events with large numbers of people in one location, like concerts or sporting events. . If you have some symptoms but not all symptoms, continue to monitor at home and seek medical  attention if your symptoms worsen. . If you are having a medical emergency, call 911.   [place] / e-Visit: [email or web address]         [facility]: [facility]: [id number]                   [facility]: [id number]     It is flu season:   >>> Best ways to protect herself from the flu: Receive the yearly flu vaccine, practice good hand hygiene washing with soap and also using hand sanitizer when available, eat a nutritious meals, get adequate rest, hydrate appropriately   Please contact the office if your symptoms worsen or you have concerns that you are not improving.   Thank you for choosing [facility] for your healthcare, and for allowing [place] to partner with you on your healthcare journey. I am thankful to be able to provide care to you today.   Natasha Quaker FNP-C

## 2019-10-03 NOTE — Assessment & Plan Note (Signed)
Plan: Continue to follow-up with [facility] oncology

## 2019-10-03 NOTE — Telephone Encounter (Signed)
Thank you for working on this.  Natasha Peters

## 2019-10-03 NOTE — Telephone Encounter (Signed)
Patient dropped off completed application today.  Submitted Patient Assistance Application to [place] for [employer] along with provider portion Tonia Brooms) and income documents.Will update patient when we receive a response.  Will send documents to scan center.  Fax# [phone] Phone# [phone]  3:28 PM Natasha Peters, CPhT

## 2019-10-03 NOTE — Assessment & Plan Note (Signed)
Plan: Continue CPAP 

## 2019-10-03 NOTE — Progress Notes (Signed)
@Patient  ID: Natasha Peters, female    DOB: September 22, 1951, 68 y.o.   MRN: [id number]  Chief Complaint  Patient presents with  . Follow-up    Patient reports that she has sob with exertion and can't walk very far.     Referring provider: Manfred Shirts, PA  HPI:  68 year old female former smoker followed in our office for adenocarcinoma and COPD with asthma  Past medical history: Followed by oncology for malignant neoplasm of sigmoid colon (managed on Lonsurf - [facility]), allergic rhinitis, OSA  Smoking history: Former smoker.  Quit 2005.  40-pack-year smoking history. Maintenance: Celedonio Miyamoto Patient of Dr. Lamonte Sakai  10/03/2019  - Visit   68 year old female former smoker followed in our office for COPD with asthma.  She also is a history of adenocarcinoma.  Patient had a recent abnormal CT in 09/24/2019.  Plans based off awake forward oncology notes is to start radiation on 10/16/2019.  She continues to have ongoing shortness of breath.  She feels that this is worsened as of late.  She has no other acute concerns such as wheezing or discolored mucus.   Recent imaging work-up at [facility] oncology listed below:  07/14/2019-PET scan: Or rectal cancer IMPRESSION: 1. Increased metabolic activity of the 2.0 cm lingular pulmonary nodule, with increased metabolic activity in the right paratracheal lymph node and adjacent right subcarinal lymph node, suspicious for mild progression of malignancy. 2. Faintly accentuated left hilar activity, greater than blood pool but less than liver activity, without a well-defined lymph node visible on the CT data. 3. No findings of metastatic disease to the neck, abdomen/pelvis, or skeleton. 4. Other imaging findings of potential clinical significance: Aortic Atherosclerosis (ICD10-I70.0). Coronary atherosclerosis. Diffuse hepatic steatosis. Supraumbilical hernia containing adipose tissue. Lower rectus diastasis with herniated loops of large and small  bowel. Mild chronic left maxillary sinusitis.  09/24/2019-CT chest abdomen with contrast IMPRESSION: 1. New/enlarging pulmonary metastases and mediastinal adenopathy. Subcarinal lymph node appears to erode into trachea at the carina, as before. 2. Hepatic steatosis. 3. Diastasis of the rectus abdominus musculature with a large midline hernia containing unobstructed small bowel and colon. 4. Hepatic steatosis. 5. Aortic atherosclerosis (ICD10-170.0). Coronary artery calcification.  Patient also underwent a bronchoscopy earlier this year in July  Questionaires / Pulmonary Flowsheets:   MMRC: mMRC Dyspnea Scale mMRC Score  10/03/2019 3    Tests:   11/01/2017-chest x-ray-no pneumothorax or acute finding, mildly improved lung volumes, persistent atelectasis in bases  10/26/2017-pulmonary function test-FVC 2.03 (69% predicted), postbronchodilator ratio 84, postbronchodilator FEV1 1.68 (76% predicted), no positive bronchodilator response, DLCO 18.76 (81% predicted)  05/02/2019-CTA chest-negative for acute PE, 2.1 cm renal mass with compromise of the proximal left mainstem bronchus, consider bronchoscopy evaluation, 2.1 cm lingular nodule previously 1.9  07/14/2019-PET scan: Or rectal cancer IMPRESSION: 1. Increased metabolic activity of the 2.0 cm lingular pulmonary nodule, with increased metabolic activity in the right paratracheal lymph node and adjacent right subcarinal lymph node, suspicious for mild progression of malignancy. 2. Faintly accentuated left hilar activity, greater than blood pool but less than liver activity, without a well-defined lymph node visible on the CT data. 3. No findings of metastatic disease to the neck, abdomen/pelvis, or skeleton. 4. Other imaging findings of potential clinical significance: Aortic Atherosclerosis (ICD10-I70.0). Coronary atherosclerosis. Diffuse hepatic steatosis. Supraumbilical hernia containing adipose tissue. Lower rectus  diastasis with herniated loops of large and small bowel. Mild chronic left maxillary sinusitis.  09/24/2019-CT chest abdomen with contrast IMPRESSION: 1. New/enlarging pulmonary metastases  and mediastinal adenopathy. Subcarinal lymph node appears to erode into trachea at the carina, as before. 2. Hepatic steatosis. 3. Diastasis of the rectus abdominus musculature with a large midline hernia containing unobstructed small bowel and colon. 4. Hepatic steatosis. 5. Aortic atherosclerosis (ICD10-170.0). Coronary artery calcification.  FENO:  No results found for: NITRICOXIDE  PFT: PFT Results Latest Ref Rng & Units 10/26/2017  FVC-Pre L 2.03  FVC-Predicted Pre % 69  FVC-Post L 2.01  FVC-Predicted Post % 68  Pre FEV1/FVC % % 78  Post FEV1/FCV % % 84  FEV1-Pre L 1.57  FEV1-Predicted Pre % 71  FEV1-Post L 1.68  DLCO UNC% % 81  DLCO COR %Predicted % 99  TLC L 4.12  TLC % Predicted % 84  RV % Predicted % 93    WALK:  SIX MIN WALK 10/03/2019 04/01/2018  Supplimental Oxygen during Test? (L/min) No -  Tech Comments: Patient was able to complete 1/2 a lap. Patient walked at a slow, guarded pace. She stated she is often unsteady on her feet. After walking 1/2 lap, she C/O of SOB and chest tightness. Her HR was 152 when she sat down. After 1 minute, HR dropped to 120. O2 remained at 98% Only able to walk 1 lap at a normal pace, SOB halfway into lap.     Imaging: No results found.  Lab Results:  CBC    Component Value Date/Time   WBC 13.2 (H) 11/01/2017 0333   RBC 4.78 11/01/2017 0333   HGB 14.0 11/01/2017 0333   HCT 43.8 11/01/2017 0333   PLT 235 11/01/2017 0333   MCV 91.6 11/01/2017 0333   MCH 29.3 11/01/2017 0333   MCHC 32.0 11/01/2017 0333   RDW 14.5 11/01/2017 0333    BMET    Component Value Date/Time   NA 141 11/01/2017 0333   K 4.0 11/01/2017 0333   CL 108 11/01/2017 0333   CO2 26 11/01/2017 0333   GLUCOSE 106 (H) 11/01/2017 0333   BUN 12 11/01/2017 0333    CREATININE 0.84 11/01/2017 0333   CALCIUM 9.5 11/01/2017 0333   GFRNONAA >60 11/01/2017 0333   GFRAA >60 11/01/2017 0333    BNP    Component Value Date/Time   BNP 289.9 (H) 10/31/2017 1344    ProBNP No results found for: PROBNP  Specialty Problems      Pulmonary Problems   Allergic rhinitis   Asthma-COPD overlap syndrome (HCC)   Obstructive sleep apnea   Acute respiratory failure (HCC)   Dyspnea      Allergies  Allergen Reactions  . Actonel [Risedronate Sodium] Swelling  . Drixoral Allergy Sinus [Dexbromphen-Pse-Apap Er] Swelling  . Nsaids Diarrhea    Bloody stool  . Other     No seeds, nuts, or husk due to diverticulitis    Immunization History  Administered Date(s) Administered  . Influenza, High Dose Seasonal PF 08/02/2015, 09/10/2017, 11/01/2018, 09/26/2019  . Influenza,inj,Quad PF,6+ Mos 08/07/2016  . Influenza-Unspecified 07/09/2013, 07/14/2014, 08/02/2015, 08/07/2016  . Pneumococcal Conjugate-13 01/31/2016  . Pneumococcal Polysaccharide-23 07/12/2009, 02/20/2017  . Tdap 01/02/2011, 01/02/2011, 12/09/2012, 12/09/2012  . Zoster 01/11/2012  . Zoster Recombinat (Shingrix) 01/11/2012    Past Medical History:  Diagnosis Date  . Arthritis   . Asthma   . Astigmatism with presbyopia   . Benign neoplasm of right choroid   . Choroidal nevus, right   . Colon cancer ([place])   . Colon neoplasm   . Colon polyp   . Diverticulitis   . Hemorrhoids   .  History of kidney stones    pmh  . Hx of gallstones    had chole  . Hyperlipidemia   . 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 Mediastinal lymphadenopathy   . Posterior capsular opacification non visually significant of left eye   . Pseudophakia of both eyes   . Pulmonary nodule   . PVD (posterior vitreous detachment), both eyes   . Sleep apnea     Tobacco History: Social History   Tobacco Use  Smoking Status Former Smoker  . Packs/day: 1.00  . Years: 40.00  . Pack  years: 40.00  . Types: Cigarettes  . Quit date: 11/14/2003  . Years since quitting: 15.8  Smokeless Tobacco Never Used   Counseling given: Yes  Continue to not smoke  Outpatient Encounter Medications as of 10/03/2019  Medication Sig  . Acetaminophen (TYLENOL ARTHRITIS PAIN PO) Take by mouth.  Marland Kitchen albuterol (PROVENTIL HFA;VENTOLIN HFA) 108 (90 Base) MCG/ACT inhaler Inhale 2 puffs into the lungs every 6 (six) hours as needed for wheezing or shortness of breath.  Marland Kitchen aspirin EC 81 MG tablet Take 81 mg by mouth daily.  . Carboxymethylcellul-Glycerin (LUBRICATING EYE DROPS OP) Apply 1 drop to eye daily as needed (dry eyes).  . Cholecalciferol (VITAMIN D3) 1000 units CAPS Take 1,000 Units by mouth daily.   . Flaxseed, Linseed, (FLAXSEED OIL) 1000 MG CAPS Take 1,000 mg by mouth daily.   . fluticasone (FLONASE) 50 MCG/ACT nasal spray Place 1 spray into both nostrils daily. (Patient taking differently: Place 1 spray into both nostrils daily as needed. )  . KRILL OIL PO Take 1 capsule by mouth daily.  . Multiple Vitamin (MULTIVITAMIN WITH MINERALS) TABS tablet Take 1 tablet by mouth daily.  . Multiple Vitamins-Minerals (PRESERVISION AREDS 2 PO) Take 1 tablet by mouth 2 (two) times daily.   . rivaroxaban (XARELTO) 20 MG TABS tablet Take 20 mg by mouth daily with supper.  . triamterene-hydrochlorothiazide (MAXZIDE) 75-50 MG tablet Take 0.5 tablets by mouth daily as needed (for swelling).   Marland Kitchen umeclidinium-vilanterol (ANORO ELLIPTA) 62.5-25 MCG/INH AEPB INHALE 1 PUFF INTO THE LUNGS DAILY  . [DISCONTINUED] ANORO ELLIPTA 62.5-25 MCG/INH AEPB INHALE 1 PUFF INTO THE LUNGS DAILY  . umeclidinium-vilanterol (ANORO ELLIPTA) 62.5-25 MCG/INH AEPB Inhale 1 puff into the lungs daily.  . [DISCONTINUED] cetirizine (ZYRTEC) 10 MG tablet Take 10 mg by mouth daily as needed for allergies.   . [DISCONTINUED] doxycycline (VIBRA-TABS) 100 MG tablet Take 1 tablet (100 mg total) by mouth 2 (two) times daily.  . [DISCONTINUED]  predniSONE (DELTASONE) 10 MG tablet 4 tabs for 2 days, then 3 tabs for 2 days, 2 tabs for 2 days, then 1 tab for 2 days, then stop  . [DISCONTINUED] trifluridine-tipiracil (LONSURF) 15-6.14 MG tablet Take by mouth.   No facility-administered encounter medications on file as of 10/03/2019.      Review of Systems  Review of Systems  Constitutional: Positive for fatigue. Negative for activity change and fever.  HENT: Positive for congestion, postnasal drip and rhinorrhea. Negative for sinus pressure, sinus pain and sore throat.   Respiratory: Positive for shortness of breath and wheezing. Negative for cough.   Cardiovascular: Positive for leg swelling. Negative for chest pain and palpitations.  Gastrointestinal: Negative for diarrhea, nausea and vomiting.  Musculoskeletal: Negative for arthralgias.  Neurological: Negative for dizziness.  Psychiatric/Behavioral: Negative for sleep disturbance. The patient is not nervous/anxious.      Physical Exam  BP 120/72 (BP Location:  Right Arm, Cuff Size: Normal)   Pulse 89   Temp (!) 97 F (36.1 C) (Temporal)   Ht 5\' 2"  (1.575 m)   Wt 252 lb (114.3 kg)   SpO2 96% Comment: on room air  BMI 46.09 kg/m   Wt Readings from Last 5 Encounters:  10/03/19 252 lb (114.3 kg)  11/01/18 243 lb 6.4 oz (110.4 kg)  05/10/18 249 lb (112.9 kg)  04/01/18 247 lb 6.4 oz (112.2 kg)  11/23/17 245 lb 3.2 oz (111.2 kg)    BMI Readings from Last 5 Encounters:  10/03/19 46.09 kg/m  11/01/18 44.52 kg/m  05/10/18 45.54 kg/m  04/01/18 45.25 kg/m  11/23/17 44.85 kg/m     Physical Exam Vitals signs and nursing note reviewed.  Constitutional:      General: She is not in acute distress.    Appearance: Normal appearance. She is obese.     Comments: Chronically ill elderly female  HENT:     Head: Normocephalic and atraumatic.     Right Ear: Tympanic membrane, ear canal and external ear normal. There is no impacted cerumen.     Left Ear: Tympanic  membrane, ear canal and external ear normal. There is no impacted cerumen.     Nose: Rhinorrhea present. No congestion.     Mouth/Throat:     Mouth: Mucous membranes are moist.     Pharynx: Oropharynx is clear.     Comments: Postnasal drip Eyes:     Pupils: Pupils are equal, round, and reactive to light.  Neck:     Musculoskeletal: Normal range of motion.  Cardiovascular:     Rate and Rhythm: Normal rate and regular rhythm.     Pulses: Normal pulses.     Heart sounds: Normal heart sounds. No murmur.  Pulmonary:     Effort: Pulmonary effort is normal. No respiratory distress.     Breath sounds: No decreased air movement. No decreased breath sounds, wheezing or rales.  Abdominal:     General: Abdomen is flat. Bowel sounds are normal.     Palpations: Abdomen is soft.     Comments: Obese  Musculoskeletal:     Right lower leg: Edema present.     Left lower leg: Edema present.  Skin:    General: Skin is warm and dry.     Capillary Refill: Capillary refill takes less than 2 seconds.  Neurological:     General: No focal deficit present.     Mental Status: She is alert and oriented to person, place, and time. Mental status is at baseline.     Gait: Gait abnormal (Shuffling gait, only able to complete half a lap).  Psychiatric:        Mood and Affect: Mood normal.        Behavior: Behavior normal.        Thought Content: Thought content normal.        Judgment: Judgment normal.       Assessment & Plan:   Allergic rhinitis Plan: Start nasal saline rinses twice daily Sample provided today Flonase continue Okay to stop Zyrtec  Asthma-COPD overlap syndrome ([place]) Plan: Continue Anoro Ellipta Continue reviewed the clinical pharmacy team Continue Flonase Do nasal saline rinses twice daily  Obstructive sleep apnea Plan: Continue CPAP  Abnormal findings on diagnostic imaging of lung Plan: Continue to follow-up with [facility] oncology  Medication management Plan:  Continue to work with clinical pharmacy team to qualify for [place] for you    Return in  about 2 months (around 12/03/2019), or if symptoms worsen or fail to improve, for Follow up with Dr. Lamonte Sakai.   Lauraine Rinne, NP 10/03/2019   This appointment was 28 minutes long with over 50% of the time in direct face-to-face patient care, assessment, plan of care, and follow-up.

## 2019-10-03 NOTE — Assessment & Plan Note (Signed)
Plan: Start nasal saline rinses twice daily Sample provided today Flonase continue Okay to stop Zyrtec

## 2019-10-13 ENCOUNTER — Telehealth: Payer: Self-pay | Admitting: Pharmacist

## 2019-10-13 NOTE — Telephone Encounter (Signed)
Patient Assistance Program Note 1 (Date 10/13/2019)  Patient: Natasha Peters DOB: 1951-02-08 Medication (Patient Assistance Program): Anoro Ellipta ([place]) Enrollment Date: 10/13/2019 - 10/12/2020 Shipping Address: [address] [place] [address] Prescriber: Wyn Quaker, FNP-C  Called pt on 10/13/2019 at 4:08 PM to notify her of approval of [place] patient assistance program application and to let her know that Anoro Ellipta rx will be shipped to her house within 7-10 business days. Pt verbalized understanding and expressed appreciation.

## 2019-10-13 NOTE — Telephone Encounter (Signed)
Thoughts fantastic!  Thank you for working with the patient  Natasha Quaker, FNP

## 2019-10-27 ENCOUNTER — Telehealth: Payer: Self-pay | Admitting: Emergency Medicine

## 2019-10-27 MED ORDER — ANORO ELLIPTA 62.5-25 MCG/INH IN AEPB: 1.0000 | INHALATION_SPRAY | Freq: Every day | RESPIRATORY_TRACT | 0 refills | Status: DC

## 2019-10-27 NOTE — Telephone Encounter (Signed)
Spoke with patient.  She states she is still waiting on medication to be delivered. Patient assistance was sent to [facility] on 10/03/19. I gave patient 2 weeks of samples, to get her until her medication is delivered.   Nothing further needed at this time

## 2020-01-21 NOTE — Progress Notes (Signed)
@Patient  ID: Natasha Peters, female    DOB: 15-Nov-1950, 69 y.o.   MRN: [id number]  Chief Complaint  Patient presents with  . Follow-up    F/U on COPD. Increased coughing and SOB since starting Keytruda.     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  Quit 2005.  40-pack-year smoking history. Maintenance: Celedonio Miyamoto Patient of Dr. Lamonte Sakai  01/22/2020  - Visit   69 year old female former smoker followed in our office for history of adenocarcinoma as well as COPD with asthma.  She was last seen in our office on 10/03/2019.  At that time she was encouraged to remain on Anoro Ellipta, continue Flonase, start nasal saline rinses, and continue to follow-up with [facility] oncology.  Patient has since been established with [facility] oncology for treatment of a malignant neoplasm of her sigmoid colon.  Patient reporting today she has noticed that her shortness of breath has worsened over the last 2 weeks.  She is unsure if this is directly related to [facility] or if it was after she received her first Covid vaccine which was on 01/09/2020.  She is not maintained on any oxygen.  She is also had occasional diarrhea episodes.  She has not yet followed up with [facility] oncology regarding this.  Patient does have occasional episodes of intermittent wheezing.  She reports adherence to her Anoro Ellipta but she needs refills of this today.  Walk today in office patient was only able to walk around 5 feet before having to stop due to exhaustion and dyspnea, no oxygen desaturations.  No episodes of hemoptysis.  No unilateral leg swelling or leg pain.  Mild tachycardia on exam today. On Xarelto.   Wells Criteria Modified Wells criteria: Clinical assessment for PE  Clinical symptoms of DVT (leg swelling, pain with palpation) - 3 Other diagnoses less likely than pulmonary embolism - 3 Heart rate greater than 100 - 1.5 Immobilization (disease greater in 3 days) or surgery in the previous 4 weeks - 1.5 Previous DVT/PE - 1.5 Hemoptysis - 1 Malignancy - 1  Probability Traditional clinical probability assessment (Wells criteria) High - greater than 6 Moderate - 2 to 6 Low less than 2  Simplify clinical probability assessment (modified Wells criteria) PE likely-greater than 4 PE unlikely little less than or equal to 4   Chart review: 01/13/2020-office visit with primary care patient was encouraged to follow back up with our office due to difficulty breathing.  She is encouraged to continue to follow-up with oncology as well as starting Keytruda.  She is encouraged to follow back up with primary care in 3 months  Last office visit in February/2021 with [facility] oncology Dr. Wendee Beavers.  Office note reviewed.  She is followed there for stage IV colon cancer.  She is found to have deteriorating symptoms.  She was started on Keytruda as a palliative care.  Her imaging scans were reviewed with her at that time where her oncology was found to notice progression of her disease.    Questionaires / Pulmonary Flowsheets:   MMRC: mMRC Dyspnea Scale mMRC Score  01/22/2020 3  10/03/2019 3    Tests:    11/01/2017-chest x-ray-no pneumothorax or acute finding, mildly improved lung volumes, persistent atelectasis in bases  10/26/2017-pulmonary function test-FVC 2.03 (  69% predicted), postbronchodilator ratio 84, postbronchodilator FEV1 1.68 (76% predicted), no positive bronchodilator response, DLCO 18.76 (81% predicted)  05/02/2019-CTA chest-negative for acute PE, 2.1 cm renal mass with compromise of the proximal left mainstem bronchus, consider bronchoscopy evaluation, 2.1 cm lingular nodule previously 1.9  07/14/2019-PET scan: Or rectal cancer IMPRESSION:  1. Increased metabolic activity of the 2.0 cm lingular pulmonary nodule, with increased metabolic activity in the right paratracheal lymph node and adjacent right subcarinal lymph node, suspicious for mild progression of malignancy. 2. Faintly accentuated left hilar activity, greater than blood pool but less than liver activity, without a well-defined lymph node visible on the CT data. 3. No findings of metastatic disease to the neck, abdomen/pelvis, or skeleton. 4. Other imaging findings of potential clinical significance: Aortic Atherosclerosis (ICD10-I70.0). Coronary atherosclerosis. Diffuse hepatic steatosis. Supraumbilical hernia containing adipose tissue. Lower rectus diastasis with herniated loops of large and small bowel. Mild chronic left maxillary sinusitis.  09/24/2019-CT chest abdomen with contrast IMPRESSION: 1. New/enlarging pulmonary metastases and mediastinal adenopathy. Subcarinal lymph node appears to erode into trachea at the carina, as before. 2. Hepatic steatosis. 3. Diastasis of the rectus abdominus musculature with a large midline hernia containing unobstructed small bowel and colon. 4. Hepatic steatosis. 5. Aortic atherosclerosis (ICD10-170.0). Coronary artery calcification.  12/31/2019-PET scan-Or rectal cancer-interval progression of disease compared from 09/24/2019.  FDG avid pulmonary nodules are increased in size and multiplicity compared from previous exams, persistent and progressive FDG uptake avid right supraclavicular mediastinal and hilar adenopathy  FENO:  No results found for: NITRICOXIDE  PFT: PFT Results Latest Ref Rng & Units 10/26/2017  FVC-Pre L 2.03  FVC-Predicted Pre % 69  FVC-Post L 2.01  FVC-Predicted Post % 68  Pre FEV1/FVC % % 78  Post FEV1/FCV % % 84  FEV1-Pre L 1.57  FEV1-Predicted Pre % 71  FEV1-Post L 1.68  DLCO UNC% % 81  DLCO COR %Predicted % 99  TLC L 4.12  TLC % Predicted % 84  RV % Predicted % 93    WALK:  SIX  MIN WALK 01/22/2020 10/03/2019 04/01/2018  Supplimental Oxygen during Test? (L/min) No No -  Tech Comments: Patient was able to walk a few feet before having to sit back down in the wheelchair. Her breathing became very labored and she was visibly SOB. O2 was at 94%, HR was 125. Patient was unable to complete the rest of walk. Patient was able to complete 1/2 a lap. Patient walked at a slow, guarded pace. She stated she is often unsteady on her feet. After walking 1/2 lap, she C/O of SOB and chest tightness. Her HR was 152 when she sat down. After 1 minute, HR dropped to 120. O2 remained at 98% Only able to walk 1 lap at a normal pace, SOB halfway into lap.     Imaging: No results found.  Lab Results:  CBC    Component Value Date/Time   WBC 13.2 (H) 11/01/2017 0333   RBC 4.78 11/01/2017 0333   HGB 14.0 11/01/2017 0333   HCT 43.8 11/01/2017 0333   PLT 235 11/01/2017 0333   MCV 91.6 11/01/2017 0333   MCH 29.3 11/01/2017 0333   MCHC 32.0 11/01/2017 0333   RDW 14.5 11/01/2017 0333    BMET    Component Value Date/Time   NA 141 11/01/2017 0333   K 4.0 11/01/2017 0333   CL 108 11/01/2017 0333   CO2 26 11/01/2017 0333   GLUCOSE 106 (H) 11/01/2017 0333   BUN 12 11/01/2017 0333  CREATININE 0.84 11/01/2017 0333   CALCIUM 9.5 11/01/2017 0333   GFRNONAA >60 11/01/2017 0333   GFRAA >60 11/01/2017 0333    BNP    Component Value Date/Time   BNP 289.9 (H) 10/31/2017 1344    ProBNP No results found for: PROBNP  Specialty Problems      Pulmonary Problems   Allergic rhinitis   Asthma-COPD overlap syndrome (HCC)   Obstructive sleep apnea   Acute respiratory failure (HCC)   Dyspnea      Allergies  Allergen Reactions  . Actonel [Risedronate Sodium] Swelling  . Drixoral Allergy Sinus [Dexbromphen-Pse-Apap Er] Swelling  . Nsaids Diarrhea    Bloody stool  . Other     No seeds, nuts, or husk due to diverticulitis    Immunization History  Administered Date(s) Administered  .  Influenza, High Dose Seasonal PF 08/02/2015, 09/10/2017, 11/01/2018, 09/26/2019  . Influenza,inj,Quad PF,6+ Mos 08/07/2016  . Influenza-Unspecified 07/09/2013, 07/14/2014, 08/02/2015, 08/07/2016  . Moderna SARS-COVID-2 Vaccination 01/13/2020  . Pneumococcal Conjugate-13 01/31/2016  . Pneumococcal Polysaccharide-23 07/12/2009, 02/20/2017  . Tdap 01/02/2011, 01/02/2011, 12/09/2012, 12/09/2012  . Zoster 01/11/2012  . Zoster Recombinat (Shingrix) 01/11/2012    Past Medical History:  Diagnosis Date  . Arthritis   . Asthma   . Astigmatism with presbyopia   . Benign neoplasm of right choroid   . Choroidal nevus, right   . Colon cancer ([place])   . Colon neoplasm   . Colon polyp   . Diverticulitis   . Hemorrhoids   . History of kidney stones    pmh  . Hx of gallstones    had chole  . Hyperlipidemia   . 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 Mediastinal lymphadenopathy   . Posterior capsular opacification non visually significant of left eye   . Pseudophakia of both eyes   . Pulmonary nodule   . PVD (posterior vitreous detachment), both eyes   . Sleep apnea     Tobacco History: Social History   Tobacco Use  Smoking Status Former Smoker  . Packs/day: 1.00  . Years: 40.00  . Pack years: 40.00  . Types: Cigarettes  . Quit date: 11/14/2003  . Years since quitting: 16.2  Smokeless Tobacco Never Used   Counseling given: Yes   Continue to not smoke  Outpatient Encounter Medications as of 01/22/2020  Medication Sig  . Acetaminophen (TYLENOL ARTHRITIS PAIN PO) Take by mouth.  Marland Kitchen albuterol (PROVENTIL HFA;VENTOLIN HFA) 108 (90 Base) MCG/ACT inhaler Inhale 2 puffs into the lungs every 6 (six) hours as needed for wheezing or shortness of breath.  Marland Kitchen aspirin EC 81 MG tablet Take 81 mg by mouth daily.  . Carboxymethylcellul-Glycerin (LUBRICATING EYE DROPS OP) Apply 1 drop to eye daily as needed (dry eyes).  . Cholecalciferol (VITAMIN D3) 1000 units CAPS  Take 1,000 Units by mouth daily.   . Flaxseed, Linseed, (FLAXSEED OIL) 1000 MG CAPS Take 1,000 mg by mouth daily.   . fluticasone (FLONASE) 50 MCG/ACT nasal spray Place 1 spray into both nostrils daily. (Patient taking differently: Place 1 spray into both nostrils daily as needed. )  . KRILL OIL PO Take 1 capsule by mouth daily.  . metoprolol succinate (TOPROL-XL) 25 MG 24 hr tablet Take by mouth.  . Multiple Vitamin (MULTIVITAMIN WITH MINERALS) TABS tablet Take 1 tablet by mouth daily.  . Multiple Vitamins-Minerals (PRESERVISION AREDS 2 PO) Take 1 tablet by mouth 2 (two) times daily.   . pembrolizumab (KEYTRUDA) 100 MG/4ML SOLN  Inject into the vein.  . potassium chloride (KLOR-CON) 20 MEQ packet Take by mouth.  . rivaroxaban (XARELTO) 20 MG TABS tablet Take 20 mg by mouth daily with supper.  . triamterene-hydrochlorothiazide (MAXZIDE) 75-50 MG tablet Take 0.5 tablets by mouth daily as needed (for swelling).   Marland Kitchen umeclidinium-vilanterol (ANORO ELLIPTA) 62.5-25 MCG/INH AEPB Inhale 1 puff into the lungs daily.  Marland Kitchen azithromycin (ZITHROMAX) 250 MG tablet 500mg  (two tablets) today, then 250mg  (1 tablet) for the next 4 days  . predniSONE (DELTASONE) 10 MG tablet 4 tabs for 2 days, then 3 tabs for 2 days, 2 tabs for 2 days, then 1 tab for 2 days, then stop  . umeclidinium-vilanterol (ANORO ELLIPTA) 62.5-25 MCG/INH AEPB Inhale 1 puff into the lungs daily.  . [DISCONTINUED] umeclidinium-vilanterol (ANORO ELLIPTA) 62.5-25 MCG/INH AEPB INHALE 1 PUFF INTO THE LUNGS DAILY   No facility-administered encounter medications on file as of 01/22/2020.     Review of Systems  Review of Systems  Constitutional: Positive for fatigue. Negative for activity change and fever.  HENT: Negative for sinus pressure, sinus pain and sore throat.   Respiratory: Positive for cough and shortness of breath. Negative for wheezing.   Cardiovascular: Negative for chest pain and palpitations.  Gastrointestinal: Negative for  diarrhea, nausea and vomiting.  Musculoskeletal: Negative for arthralgias.  Neurological: Negative for dizziness.  Psychiatric/Behavioral: Negative for sleep disturbance. The patient is not nervous/anxious.      Physical Exam  BP 124/72 (BP Location: Left Arm, Patient Position: Sitting, Cuff Size: Normal)   Pulse (!) 106   Temp 98.4 F (36.9 C) (Temporal)   Ht 5\' 2"  (1.575 m)   Wt 242 lb (109.8 kg)   SpO2 95% Comment: on RA  BMI 44.26 kg/m   Wt Readings from Last 5 Encounters:  01/22/20 242 lb (109.8 kg)  10/03/19 252 lb (114.3 kg)  11/01/18 243 lb 6.4 oz (110.4 kg)  05/10/18 249 lb (112.9 kg)  04/01/18 247 lb 6.4 oz (112.2 kg)    BMI Readings from Last 5 Encounters:  01/22/20 44.26 kg/m  10/03/19 46.09 kg/m  11/01/18 44.52 kg/m  05/10/18 45.54 kg/m  04/01/18 45.25 kg/m     Physical Exam Vitals and nursing note reviewed.  Constitutional:      General: She is not in acute distress.    Appearance: Normal appearance. She is obese.     Comments: Chronically ill frail female  HENT:     Head: Normocephalic and atraumatic.     Right Ear: Tympanic membrane, ear canal and external ear normal. There is no impacted cerumen.     Left Ear: Tympanic membrane, ear canal and external ear normal. There is no impacted cerumen.     Ears:     Comments: Hearing aid left ear, removed for exam    Nose: Nose normal. No congestion or rhinorrhea.     Mouth/Throat:     Mouth: Mucous membranes are moist.     Pharynx: Oropharynx is clear.     Comments: Postnasal drip Eyes:     Pupils: Pupils are equal, round, and reactive to light.  Cardiovascular:     Rate and Rhythm: Normal rate and regular rhythm.     Pulses: Normal pulses.     Heart sounds: Normal heart sounds. No murmur.  Pulmonary:     Effort: Pulmonary effort is normal. No respiratory distress.     Breath sounds: No decreased air movement. Examination of the left-upper field reveals wheezing. Examination of the left-middle  field reveals wheezing. Examination of the left-lower field reveals decreased breath sounds. Decreased breath sounds and wheezing (exp wheeze ) present. No rales.  Musculoskeletal:     Cervical back: Normal range of motion.     Right lower leg: Edema (1-2+ lower extremity swelling) present.     Left lower leg: Edema (1-2+ lower extremity swelling) present.  Skin:    General: Skin is warm and dry.     Capillary Refill: Capillary refill takes less than 2 seconds.  Neurological:     General: No focal deficit present.     Mental Status: She is alert and oriented to person, place, and time. Mental status is at baseline.     Gait: Gait abnormal (In wheelchair, limited walk).  Psychiatric:        Mood and Affect: Mood normal.        Behavior: Behavior normal.        Thought Content: Thought content normal.        Judgment: Judgment normal.       Assessment & Plan:   Obstructive sleep apnea Plan: Continue CPAP therapy  Asthma-COPD overlap syndrome (HCC) Plan: Z-Pak today Prednisone today Continue Anoro Ellipta Continue Flonase Continue nasal saline rinses Chest x-ray today  Adenocarcinoma of colon [facility]) Discussion: Explained to patient is extremely important that she follow-up with [facility] oncology given her acute worsening symptoms.  Patient's daughter also in the room.  They both report they will follow-up with oncology today.  Plan: Continue to follow-up with [facility] oncology Contact them today reporting acute worsening shortness of breath as well as occasional diarrhea   Dyspnea Patient shortness of breath is likely multifactorial.  She does have expiratory wheezes on exam today.  She is also recently received her first Covid shot as well as started on Keytruda managed by [facility] oncology.  She has not yet followed up with [facility] oncology regarding the symptoms.  On Xarelto.  Wells criteria for PE low.  Plan: Lab work today Chest x-ray today Z-Pak  today Prednisone taper today Walk in office [facility] oncology regarding acute worsening symptoms    Return in about 4 weeks (around 02/19/2020), or if symptoms worsen or fail to improve, for Follow up with Dr. Lamonte Sakai.   Lauraine Rinne, NP 01/22/2020   This appointment required 44 minutes of patient care (this includes precharting, chart review, review of results, face-to-face care, etc.).

## 2020-01-22 ENCOUNTER — Encounter: Payer: Self-pay | Admitting: Pulmonary Disease

## 2020-01-22 ENCOUNTER — Ambulatory Visit (INDEPENDENT_AMBULATORY_CARE_PROVIDER_SITE_OTHER): Payer: Medicare HMO

## 2020-01-22 ENCOUNTER — Ambulatory Visit: Payer: Medicare HMO | Admitting: Pulmonary Disease

## 2020-01-22 ENCOUNTER — Other Ambulatory Visit: Payer: Self-pay

## 2020-01-22 VITALS — BP 124/72 | HR 106 | Temp 98.4°F | Ht 62.0 in | Wt 242.0 lb

## 2020-01-22 DIAGNOSIS — G4733 Obstructive sleep apnea (adult) (pediatric): Secondary | ICD-10-CM

## 2020-01-22 DIAGNOSIS — J449 Chronic obstructive pulmonary disease, unspecified: Secondary | ICD-10-CM | POA: Diagnosis not present

## 2020-01-22 DIAGNOSIS — C189 Malignant neoplasm of colon, unspecified: Secondary | ICD-10-CM

## 2020-01-22 DIAGNOSIS — J4489 Other specified chronic obstructive pulmonary disease: Secondary | ICD-10-CM

## 2020-01-22 DIAGNOSIS — R0602 Shortness of breath: Secondary | ICD-10-CM

## 2020-01-22 LAB — CBC WITH DIFFERENTIAL/PLATELET
Basophils Absolute: 0 10*3/uL (ref 0.0–0.1)
Basophils Relative: 0.4 % (ref 0.0–3.0)
Eosinophils Absolute: 0.2 10*3/uL (ref 0.0–0.7)
Eosinophils Relative: 2.1 % (ref 0.0–5.0)
HCT: 43.5 % (ref 36.0–46.0)
Hemoglobin: 14.6 g/dL (ref 12.0–15.0)
Lymphocytes Relative: 20.6 % (ref 12.0–46.0)
Lymphs Abs: 1.8 10*3/uL (ref 0.7–4.0)
MCHC: 33.6 g/dL (ref 30.0–36.0)
MCV: 92.5 fl (ref 78.0–100.0)
Monocytes Absolute: 0.7 10*3/uL (ref 0.1–1.0)
Monocytes Relative: 8.2 % (ref 3.0–12.0)
Neutro Abs: 6.1 10*3/uL (ref 1.4–7.7)
Neutrophils Relative %: 68.7 % (ref 43.0–77.0)
Platelets: 279 10*3/uL (ref 150.0–400.0)
RBC: 4.71 Mil/uL (ref 3.87–5.11)
RDW: 14.1 % (ref 11.5–15.5)
WBC: 8.8 10*3/uL (ref 4.0–10.5)

## 2020-01-22 LAB — COMPREHENSIVE METABOLIC PANEL
ALT: 19 U/L (ref 0–35)
AST: 17 U/L (ref 0–37)
Albumin: 3.7 g/dL (ref 3.5–5.2)
Alkaline Phosphatase: 127 U/L — ABNORMAL HIGH (ref 39–117)
BUN: 15 mg/dL (ref 6–23)
CO2: 28 mEq/L (ref 19–32)
Calcium: 10.4 mg/dL (ref 8.4–10.5)
Chloride: 100 mEq/L (ref 96–112)
Creatinine, Ser: 1.01 mg/dL (ref 0.40–1.20)
GFR: 54.03 mL/min — ABNORMAL LOW (ref >60.00)
Glucose, Bld: 144 mg/dL — ABNORMAL HIGH (ref 70–99)
Potassium: 3.6 mEq/L (ref 3.5–5.1)
Sodium: 139 mEq/L (ref 135–145)
Total Bilirubin: 0.5 mg/dL (ref 0.2–1.2)
Total Protein: 6.8 g/dL (ref 6.0–8.3)

## 2020-01-22 MED ORDER — PREDNISONE 10 MG PO TABS: ORAL_TABLET | ORAL | 0 refills | Status: DC

## 2020-01-22 MED ORDER — ANORO ELLIPTA 62.5-25 MCG/INH IN AEPB: 1.0000 | INHALATION_SPRAY | Freq: Every day | RESPIRATORY_TRACT | 0 refills | Status: DC

## 2020-01-22 MED ORDER — AZITHROMYCIN 250 MG PO TABS: ORAL_TABLET | ORAL | 0 refills | Status: AC

## 2020-01-22 MED ORDER — ANORO ELLIPTA 62.5-25 MCG/INH IN AEPB: 1.0000 | INHALATION_SPRAY | Freq: Every day | RESPIRATORY_TRACT | 0 refills | Status: AC

## 2020-01-22 NOTE — Progress Notes (Signed)
Routed x-ray results to primary care as well as oncology.Wyn Quaker FNP

## 2020-01-22 NOTE — Patient Instructions (Addendum)
You were seen today by Lauraine Rinne, NP  for:   I am glad that you came in today.  I am glad that you wanted to let us know that you are having the symptoms.  I do want you to contact oncology today to notify them of your symptoms.  We will obtain baseline lab work as well as a chest x-ray today to further evaluate your symptoms.  We will treat you with the Z-Pak given your wet sounding cough as well as expiratory wheezes on exam.  We will also treat you with a prednisone taper.   Please call oncology today  Take care and stay safe,   Susie Pousson   1. Shortness of breath  - Pro b natriuretic peptide (BNP); Future - Comp Met (CMET); Future - CBC with Differential/Platelet; Future  We will also get x-ray today  Walk today in office you are only able to walk around 5 feet before having to stop  2. Asthma-COPD overlap syndrome (HCC)  - azithromycin (ZITHROMAX) 250 MG tablet; 54m (two tablets) today, then 2566m(1 tablet) for the next 4 days  Dispense: 6 tablet; Refill: 0 - predniSONE (DELTASONE) 10 MG tablet; 4 tabs for 2 days, then 3 tabs for 2 days, 2 tabs for 2 days, then 1 tab for 2 days, then stop  Dispense: 20 tablet; Refill: 0 - DG Chest 2 View; Future  Anoro Ellipta  >>> Take 1 puff daily in the morning right when you wake up >>>Rinse your mouth out after use >>>This is a daily maintenance inhaler, NOT a rescue inhaler >>>Contact our office if you are having difficulties affording or obtaining this medication >>>It is important for you to be able to take this daily and not miss any doses      3. [facility] regarding her symptoms  4. Obstructive sleep apnea  Continue CPAP therapy  We recommend that you continue using your CPAP daily >>>Keep up the hard work using your device >>> Goal should be wearing this for the entire night that you are sleeping, at least 4 to 6 hours  Remember:  . Do not  drive or operate heavy machinery if tired or drowsy.  . Please notify the supply company and office if you are unable to use your device regularly due to missing supplies or machine being broken.  . Work on maintaining a healthy weight and following your recommended nutrition plan  . Maintain proper daily exercise and movement  . Maintaining proper use of your device can also help improve management of other chronic illnesses such as: Blood pressure, blood sugars, and weight management.   BiPAP/ CPAP Cleaning:  >>>Clean weekly, with Dawn soap, and bottle brush.  Set up to air dry. >>> Wipe mask out daily with wet wipe or towelette     We recommend today:  Orders Placed This Encounter  Procedures  . DG Chest 2 View    Standing Status:   Future    Number of Occurrences:   1    Standing Expiration Date:   03/23/2021    Order Specific Question:   Reason for Exam (SYMPTOM  OR DIAGNOSIS REQUIRED)    Answer:   short of breath    Order Specific Question:   Preferred imaging location?    Answer:   Internal    Order Specific Question:   Radiology Contrast Protocol - do NOT remove file path  Answer:   [email or web address]  . Pro b natriuretic peptide (BNP)    Standing Status:   Future    Standing Expiration Date:   01/21/2021  . Comp Met (CMET)    Standing Status:   Future    Standing Expiration Date:   01/21/2021  . CBC with Differential/Platelet    Standing Status:   Future    Standing Expiration Date:   01/21/2021   Orders Placed This Encounter  Procedures  . DG Chest 2 View  . Pro b natriuretic peptide (BNP)  . Comp Met (CMET)  . CBC with Differential/Platelet   Meds ordered this encounter  Medications  . azithromycin (ZITHROMAX) 250 MG tablet    Sig: 513m (two tablets) today, then 2558m(1 tablet) for the next 4 days    Dispense:  6 tablet    Refill:  0  . predniSONE (DELTASONE) 10 MG tablet    Sig: 4 tabs for 2 days, then 3 tabs for 2  days, 2 tabs for 2 days, then 1 tab for 2 days, then stop    Dispense:  20 tablet    Refill:  0    Follow Up:    Return in about 4 weeks (around 02/19/2020), or if symptoms worsen or fail to improve, for Follow up with Dr. ByLamonte Sakai  Please do your part to reduce the spread of COVID-19:      Reduce your risk of any infection  and COVID19 by using the similar precautions used for avoiding the common cold or flu:  . Marland Kitchenash your hands often with soap and warm water for at least 20 seconds.  If soap and water are not readily available, use an alcohol-based hand sanitizer with at least 60% alcohol.  . If coughing or sneezing, cover your mouth and nose by coughing or sneezing into the elbow areas of your shirt or coat, into a tissue or into your sleeve (not your hands). . Langley Gauss MASK when in public  . Avoid shaking hands with others and consider head nods or verbal greetings only. . Avoid touching your eyes, nose, or mouth with unwashed hands.  . Avoid close contact with people who are sick. . Avoid places or events with large numbers of people in one location, like concerts or sporting events. . If you have some symptoms but not all symptoms, continue to monitor at home and seek medical attention if your symptoms worsen. . If you are having a medical emergency, call 911.   [place] e-Visit: [email or web address]       [facility]: [facility]: [phone]                 [facility] [phone]   It is flu season:   >>> Best ways to protect herself from the flu: Receive the yearly flu vaccine, practice good hand hygiene washing with soap and also using hand sanitizer when available, eat a nutritious meals, get adequate rest, hydrate appropriately   Please contact the office if your symptoms worsen or you have concerns that  you are not improving.   Thank you for choosing [facility] for your healthcare, and for allowing [facility] partner with you on your healthcare journey. I am thankful to be able to provide care to you today.   BrWyn QuakerNP-C

## 2020-01-22 NOTE — Assessment & Plan Note (Signed)
Plan: Z-Pak today Prednisone today Continue Anoro Ellipta Continue Flonase Continue nasal saline rinses Chest x-ray today

## 2020-01-22 NOTE — Assessment & Plan Note (Addendum)
Patient shortness of breath is likely multifactorial.  She does have expiratory wheezes on exam today.  She is also recently received her first Covid shot as well as started on Keytruda managed by [facility] oncology.  She has not yet followed up with [facility] oncology regarding the symptoms.  On Xarelto.  Wells criteria for PE low.  Plan: Lab work today Chest x-ray today Z-Pak today Prednisone taper today Walk in office [place] oncology regarding acute worsening symptoms

## 2020-01-22 NOTE — Assessment & Plan Note (Signed)
Plan: Continue CPAP therapy 

## 2020-01-22 NOTE — Assessment & Plan Note (Signed)
Discussion: Explained to patient is extremely important that she follow-up with [facility] oncology given her acute worsening symptoms.  Patient's daughter also in the room.  They both report they will follow-up with oncology today.  Plan: Continue to follow-up with [facility] oncology Contact them today reporting acute worsening shortness of breath as well as occasional diarrhea

## 2020-01-22 NOTE — Addendum Note (Signed)
Addended by: Suzzanne Cloud E on: 01/22/2020 11:35 AM   Modules accepted: Orders

## 2020-01-23 ENCOUNTER — Telehealth: Payer: Self-pay | Admitting: Pulmonary Disease

## 2020-01-23 LAB — PRO B NATRIURETIC PEPTIDE: NT-Pro BNP: 177 pg/mL (ref 0–301)

## 2020-01-23 NOTE — Telephone Encounter (Signed)
Called and spoke with Williams. I explained to her the antibiotics and prednisone were prescribed due to her mom's increased wheezing and SOB during her exam. Also went over her CXR results with her. She was concerned about the pulmonary lesions. I explained that lesions are also referred to as nodules. She verbalized understanding.   Nothing further needed at time of call.

## 2020-01-30 ENCOUNTER — Ambulatory Visit: Payer: Medicare HMO | Admitting: Emergency Medicine

## 2020-02-02 ENCOUNTER — Encounter: Payer: Self-pay | Admitting: Emergency Medicine

## 2020-02-02 ENCOUNTER — Other Ambulatory Visit: Payer: Self-pay

## 2020-02-02 ENCOUNTER — Ambulatory Visit: Payer: Medicare HMO | Admitting: Emergency Medicine

## 2020-02-02 DIAGNOSIS — J449 Chronic obstructive pulmonary disease, unspecified: Secondary | ICD-10-CM | POA: Diagnosis not present

## 2020-02-02 DIAGNOSIS — G4733 Obstructive sleep apnea (adult) (pediatric): Secondary | ICD-10-CM

## 2020-02-02 MED ORDER — STIOLTO RESPIMAT 2.5-2.5 MCG/ACT IN AERS: 2.0000 | INHALATION_SPRAY | Freq: Every day | RESPIRATORY_TRACT | 0 refills | Status: AC

## 2020-02-02 NOTE — Progress Notes (Signed)
Subjective:    Patient ID: Natasha Peters, female    DOB: 1951/03/13, 69 y.o.   MRN: [id number]  HPI  ROV 02/02/20 --69 year old woman, former smoker, who carries a diagnosis of COPD with asthmatic features. She has metastatic colon cancer that I diagnosed by endobronchial ultrasound and transbronchial biopsies, also history of CLL, DVT, OSA on CPAP.  Her oncology follow-up is at [facility].  She has been started on pembrolizumab since.  She was treated for an acute exacerbation of COPD 11 days ago with azithromycin and prednisone in the setting of severe progressive exertional dyspnea, continued on Anoro.  Some concern that her symptoms could have also been related to the initiation of the pembrolizumab - given 2/26.  She did not desaturate at that visit. She is using albuterol almost daily. She is dealing with severe dyspnea, cannot walk across the room.   Now on 60mg  pred qd for 14 d for possible pneumonitis; on levaquin since 3/19 planned for 10 days.   She has had her first COVID-19 vaccines 3/2, ? Whether these may have been a factor in her recent increased SOB  CPAP compliance is good, good clinical benefit.     Review of Systems  Constitutional: Negative for fever and unexpected weight change.  HENT: Negative for congestion, dental problem, ear pain, nosebleeds, postnasal drip, rhinorrhea, sinus pressure, sneezing, sore throat and trouble swallowing.   Eyes: Negative for redness and itching.  Respiratory: Negative for cough, chest tightness, shortness of breath and wheezing.   Cardiovascular: Negative for palpitations and leg swelling.  Gastrointestinal: Negative for nausea and vomiting.  Genitourinary: Negative for dysuria.  Musculoskeletal: Negative for joint swelling.  Skin: Negative for rash.  Neurological: Negative for headaches.  Hematological: Does not bruise/bleed easily.  Psychiatric/Behavioral: Negative for dysphoric mood. The patient is not nervous/anxious.      Past Medical History:  Diagnosis Date  . Arthritis   . Asthma   . Astigmatism with presbyopia   . Benign neoplasm of right choroid   . Choroidal nevus, right   . Colon cancer ([place])   . Colon neoplasm   . Colon polyp   . Diverticulitis   . Hemorrhoids   . History of kidney stones    pmh  . Hx of gallstones    had chole  . Hyperlipidemia   . 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 Mediastinal lymphadenopathy   . Posterior capsular opacification non visually significant of left eye   . Pseudophakia of both eyes   . Pulmonary nodule   . PVD (posterior vitreous detachment), both eyes   . Sleep apnea      Family History  Problem Relation Age of Onset  . Arthritis Father   . Hypertension Father   . Macular degeneration Father   . Macular degeneration Sister   . Diabetes Sister   . Cataracts Sister   . Hypertension Brother   . Diabetes Brother   . Macular degeneration Maternal Aunt      Social History   Socioeconomic History  . Marital status: Widowed    Spouse name: Not on file  . Number of children: Not on file  . Years of education: Not on file  . Highest education level: Not on file  Occupational History  . Not on file  Tobacco Use  . Smoking status: Former Smoker    Packs/day: 1.00    Years: 40.00    Pack years: 40.00  Types: Cigarettes    Quit date: 11/14/2003    Years since quitting: 16.2  . Smokeless tobacco: Never Used  Substance and Sexual Activity  . Alcohol use: No  . Drug use: No  . Sexual activity: Not on file  Other Topics Concern  . Not on file  Social History Narrative  . Not on file   Social Determinants of Health   Financial Resource Strain:   . Difficulty of Paying Living Expenses:   Food Insecurity:   . Worried About Charity fundraiser in the Last Year:   . Arboriculturist in the Last Year:   Transportation Needs:   . Film/video editor (Medical):   Marland Kitchen Lack of Transportation  (Non-Medical):   Physical Activity:   . Days of Exercise per Week:   . Minutes of Exercise per Session:   Stress:   . Feeling of Stress :   Social Connections:   . Frequency of Communication with Friends and Family:   . Frequency of Social Gatherings with Friends and Family:   . Attends Religious Services:   . Active Member of Clubs or Organizations:   . Attends Archivist Meetings:   Marland Kitchen Marital Status:   Intimate Partner Violence:   . Fear of Current or Ex-Partner:   . Emotionally Abused:   Marland Kitchen Physically Abused:   . Sexually Abused:      Allergies  Allergen Reactions  . Actonel [Risedronate Sodium] Swelling  . Drixoral Allergy Sinus [Dexbromphen-Pse-Apap Er] Swelling  . Nsaids Diarrhea    Bloody stool  . Other     No seeds, nuts, or husk due to diverticulitis     Outpatient Medications Prior to Visit  Medication Sig Dispense Refill  . Acetaminophen (TYLENOL ARTHRITIS PAIN PO) Take by mouth.    Marland Kitchen albuterol (PROVENTIL HFA;VENTOLIN HFA) 108 (90 Base) MCG/ACT inhaler Inhale 2 puffs into the lungs every 6 (six) hours as needed for wheezing or shortness of breath. 1 Inhaler 2  . aspirin EC 81 MG tablet Take 81 mg by mouth daily.    Marland Kitchen azithromycin (ZITHROMAX) 250 MG tablet 500mg  (two tablets) today, then 250mg  (1 tablet) for the next 4 days 6 tablet 0  . Carboxymethylcellul-Glycerin (LUBRICATING EYE DROPS OP) Apply 1 drop to eye daily as needed (dry eyes).    . Cholecalciferol (VITAMIN D3) 1000 units CAPS Take 1,000 Units by mouth daily.     . Flaxseed, Linseed, (FLAXSEED OIL) 1000 MG CAPS Take 1,000 mg by mouth daily.     . fluticasone (FLONASE) 50 MCG/ACT nasal spray Place 1 spray into both nostrils daily. (Patient taking differently: Place 1 spray into both nostrils daily as needed. ) 16 g 3  . KRILL OIL PO Take 1 capsule by mouth daily.    Marland Kitchen levofloxacin (LEVAQUIN) 750 MG tablet Take 750 mg by mouth daily. For 10 days, starting 01/30/2020    . metoprolol succinate  (TOPROL-XL) 25 MG 24 hr tablet Take by mouth.    . Multiple Vitamin (MULTIVITAMIN WITH MINERALS) TABS tablet Take 1 tablet by mouth daily.    . Multiple Vitamins-Minerals (PRESERVISION AREDS 2 PO) Take 1 tablet by mouth 2 (two) times daily.     . pembrolizumab (KEYTRUDA) 100 MG/4ML SOLN Inject into the vein.    . potassium chloride (KLOR-CON) 20 MEQ packet Take by mouth.    . predniSONE (DELTASONE) 20 MG tablet Take 20 mg by mouth 3 (three) times daily. 20 mg three times a day  for 14 days, starting 01/30/2020    . rivaroxaban (XARELTO) 20 MG TABS tablet Take 20 mg by mouth daily with supper.    . triamterene-hydrochlorothiazide (MAXZIDE) 75-50 MG tablet Take 0.5 tablets by mouth daily as needed (for swelling).     Marland Kitchen umeclidinium-vilanterol (ANORO ELLIPTA) 62.5-25 MCG/INH AEPB Inhale 1 puff into the lungs daily. 180 each 0  . predniSONE (DELTASONE) 10 MG tablet 4 tabs for 2 days, then 3 tabs for 2 days, 2 tabs for 2 days, then 1 tab for 2 days, then stop 20 tablet 0  . umeclidinium-vilanterol (ANORO ELLIPTA) 62.5-25 MCG/INH AEPB Inhale 1 puff into the lungs daily. 1 each 0  . umeclidinium-vilanterol (ANORO ELLIPTA) 62.5-25 MCG/INH AEPB Inhale 1 puff into the lungs daily. 2 each 0   No facility-administered medications prior to visit.        Objective:   Physical Exam Vitals:   02/02/20 1710  BP: 134/76  Pulse: 99  Temp: 97.6 F (36.4 C)  TempSrc: Temporal  SpO2: 96%  Weight: 243 lb 12.8 oz (110.6 kg)  Height: 5\' 2"  (1.575 m)   Gen: Pleasant, obese woman, in no distress,  normal affect  ENT: No lesions,  mouth clear,  oropharynx clear, no postnasal drip  Neck: No JVD, no stridor  Lungs: No use of accessory muscles, no crackles or wheezes  Cardiovascular: RRR, heart sounds normal, no murmur or gallops, no peripheral edema  Musculoskeletal: No deformities, no cyanosis or clubbing  Neuro: alert, non focal  Skin: Warm, no lesions or rashes     Assessment & Plan:  No  problem-specific Assessment & Plan notes found for this encounter.  Baltazar Apo, MD, PhD 02/02/2020, 5:40 PM [facility] [phone] or if no answer [phone]

## 2020-02-02 NOTE — Patient Instructions (Addendum)
We will temporarily stop Anoro.  Start Stiolto 2 puffs once daily. Try to obtain a copy of your insurance inhaler formulary so that we can review at next time and decide which medication to continue going forward Keep albuterol available use 2 puffs if needed for shortness of breath, chest tightness, wheezing.  You can use this up to every 4 hours depending on how your breathing is doing. Agree with prednisone as ordered.  Please complete your course of 60 mg daily until completely gone Get your repeat chest imaging as planned by oncology at [place] with Levaquin as ordered.  Please complete the full 10-day course Continue your Xarelto as you have been taking it Continue to wear your CPAP every night reliably Follow with Dr Lamonte Sakai in 1 month

## 2020-02-20 ENCOUNTER — Telehealth: Payer: Self-pay | Admitting: Pulmonary Disease

## 2020-02-20 NOTE — Telephone Encounter (Signed)
Please route this message to Dr. Lamonte Sakai that he saw the patient last.  He is more familiar with her.Wyn Quaker FNP

## 2020-02-20 NOTE — Telephone Encounter (Signed)
Dr. Lamonte Sakai please advise on patients mychart message:    This is [name], Natasha Peters's daughter. Her cancer specialist has her using a nebulizer with albuterol about every 4 to 6 hours but we need something a little more frequently as she is having severe difficulty breathing.  Please call her number [phone] or my cell [phone].  Thank you  [name]

## 2020-02-20 NOTE — Telephone Encounter (Signed)
Aaron Edelman please advise on patient mychart message:  Dr Warner Mccreedy,  This is [name], Natasha Peters's daughter. Her cancer specialist has her using a nebulizer with albuterol about every 4 to 6 hours but we need something a little more frequently as she is having severe difficulty breathing.  Please call her number [phone] or my cell [phone].  Thank you  [name]

## 2020-02-20 NOTE — Telephone Encounter (Signed)
Called and spoke with pt's daughter [name]. [name] stated that pt's oncologist prescribed duoneb solution to help with pt's breathing. They are wanting to know if there is anything else that could be prescribed to help with pt's breathing.  Pt was given samples of stiolto at last visit for her to prescribe and they stated that with this, pt is not gagging in the morning like she was doing when she was on the anoro due to it not being a powder form. They reviewed the formulary list and it shows that the stiolto inhaler is a tier 3 with pt's insurance.  Dr. Lamonte Sakai, please advise on all this if there is anything else that pt could try to see if it would help with her breathing as pt does not have enough stiolto to get her through until her upcoming appt with Aaron Edelman. Also, since pt does have her rescue inhaler and also the duoneb which are both as needed meds, how soon in between each should pt wait prior to using the other is another question that [name] has.

## 2020-02-23 ENCOUNTER — Encounter: Payer: Self-pay | Admitting: Pulmonary Disease

## 2020-02-23 MED ORDER — BUDESONIDE 0.25 MG/2ML IN SUSP: 0.2500 mg | Freq: Two times a day (BID) | RESPIRATORY_TRACT | 12 refills | Status: AC

## 2020-02-23 NOTE — Telephone Encounter (Signed)
Noted. Thank you.   Natasha Peters

## 2020-02-23 NOTE — Telephone Encounter (Signed)
Spoke with patient's daughter [name].  She states her mother was still short of breath. Spoke with APP Wyn Quaker as patient's daughter wanted an answer right away.  Aaron Edelman stated to start patient on pulmicort and to continue the stiolto and duoneb. Patient also scheduled for Video visit with Wyn Quaker on 02/24/20.   Order placed and sent to pharmacy of choice per patients daughter  Nothing further needed at this time.

## 2020-02-23 NOTE — Telephone Encounter (Signed)
02/23/2020  My understanding is that the patient spoke with Lauren today.  I spoke with Ander Purpura today regarding this.  I would like for the patient to remain on Stiolto Respimat.  She can start using Pulmicort nebs twice daily.  It looks like this prescription was already sent in.  Patient is scheduled for a virtual visit with our office tomorrow.  Patient also needs to notify oncology if she is having acute worsening shortness of breath.  She needs to keep them informed of their care.  If symptoms worsen in the meantime she needs to present to an emergency room for further evaluation.    Patient needs to continue to monitor her vital signs the best she can at home.  Also monitor for oxygen levels dropping.  Needs to maintain oxygen saturations above 88%.  Wyn Quaker, FNP

## 2020-02-23 NOTE — Telephone Encounter (Signed)
Aaron Edelman can you please advise for RB so I can return call with answers to daughter's questions. Thanks.

## 2020-02-24 ENCOUNTER — Telehealth (INDEPENDENT_AMBULATORY_CARE_PROVIDER_SITE_OTHER): Payer: Medicare HMO | Admitting: Pulmonary Disease

## 2020-02-24 ENCOUNTER — Encounter: Payer: Self-pay | Admitting: Pulmonary Disease

## 2020-02-24 DIAGNOSIS — J449 Chronic obstructive pulmonary disease, unspecified: Secondary | ICD-10-CM

## 2020-02-24 DIAGNOSIS — R918 Other nonspecific abnormal finding of lung field: Secondary | ICD-10-CM

## 2020-02-24 DIAGNOSIS — C189 Malignant neoplasm of colon, unspecified: Secondary | ICD-10-CM | POA: Diagnosis not present

## 2020-02-24 DIAGNOSIS — R0602 Shortness of breath: Secondary | ICD-10-CM | POA: Diagnosis not present

## 2020-02-24 DIAGNOSIS — G4733 Obstructive sleep apnea (adult) (pediatric): Secondary | ICD-10-CM

## 2020-02-24 MED ORDER — AMOXICILLIN-POT CLAVULANATE 875-125 MG PO TABS: 1.0000 | ORAL_TABLET | Freq: Two times a day (BID) | ORAL | 0 refills | Status: AC

## 2020-02-24 NOTE — Progress Notes (Signed)
Virtual Visit via Video Note  I connected with Natasha Peters on 02/24/20 at 11:00 AM EDT by a video enabled telemedicine application and verified that I am speaking with the correct person using two identifiers.  Location: Patient: Home Provider: Office - [facility] - [id number], [address], [place], [place] [address]  I discussed the limitations of evaluation and management by telemedicine and the availability of in person appointments. The patient expressed understanding and agreed to proceed. I also discussed with the patient that there may be a patient responsible charge related to this service. The patient expressed understanding and agreed to proceed.  Patient consented to consult via telephone: Yes People present and their role in pt care: Pt   History of Present Illness:  69 year old female former smoker followed in our office for adenocarcinoma and COPD with asthma  Past medical history: Followed by oncology for malignant neoplasm of sigmoid colon, allergic rhinitis, OSA  Smoking history: Former smoker.  Quit 2005.  40-pack-year smoking history. Maintenance: Stiolto Resp, Pulmicort nebs  Patient of Dr. Lamonte Peters  Chief complaint: Shortness of breath  69 year old female former smoker followed in our office for COPD with asthma and adenocarcinoma.  Patient is currently being followed by [facility] for malignant neoplasm of the sigmoid colon.  She is currently receiving treatments from oncology regarding this.  At last office visit with Dr. Lamonte Peters patient's inhalers were switched from [place] to [facility] Respimat.  Patient contacted our office reporting acute worsening shortness of breath.  Patient's daughter also on video visit today reporting that symptoms have progressively worsened.  Chest x-ray on 01/22/2020 was stable.  Lab work on 01/22/2020 did not show significant fluid retention.  Patient reports that her weights have actually decreased she has not had much of  an appetite.  She has had increased mucus production to yellow-green and now transitioning to gray.  She has an upcoming appointment with oncology on Friday.  Telephonically we started the patient on Pulmicort on 02/23/2020 due to her increased shortness of breath and increased use of albuterol as well as her duo nebs.  We will discussed this today.  Patient is still able to use her CPAP.  She does report that she has increased shortness of breath when lying back or sitting back in a chair.  Observations/Objective:  11/01/2017-chest x-ray-no pneumothorax or acute finding, mildly improved lung volumes, persistent atelectasis in bases  10/26/2017-pulmonary function test-FVC 2.03 (69% predicted), postbronchodilator ratio 84, postbronchodilator FEV1 1.68 (76% predicted), no positive bronchodilator response, DLCO 18.76 (81% predicted)  05/02/2019-CTA chest-negative for acute PE, 2.1 cm renal mass with compromise of the proximal left mainstem bronchus, consider bronchoscopy evaluation, 2.1 cm lingular nodule previously 1.9  07/14/2019-PET scan: Or rectal cancer IMPRESSION: 1. Increased metabolic activity of the 2.0 cm lingular pulmonary nodule, with increased metabolic activity in the right paratracheal lymph node and adjacent right subcarinal lymph node, suspicious for mild progression of malignancy. 2. Faintly accentuated left hilar activity, greater than blood pool but less than liver activity, without a well-defined lymph node visible on the CT data. 3. No findings of metastatic disease to the neck, abdomen/pelvis, or skeleton. 4. Other imaging findings of potential clinical significance: Aortic Atherosclerosis (ICD10-I70.0). Coronary atherosclerosis. Diffuse hepatic steatosis. Supraumbilical hernia containing adipose tissue. Lower rectus diastasis with herniated loops of large and small bowel. Mild chronic left maxillary sinusitis.  09/24/2019-CT chest abdomen with  contrast IMPRESSION: 1. New/enlarging pulmonary metastases and mediastinal adenopathy. Subcarinal lymph node appears to erode into  trachea at the carina, as before. 2. Hepatic steatosis. 3. Diastasis of the rectus abdominus musculature with a large midline hernia containing unobstructed small bowel and colon. 4. Hepatic steatosis. 5. Aortic atherosclerosis (ICD10-170.0). Coronary artery calcification.  12/31/2019-PET scan-Or rectal cancer-interval progression of disease compared from 09/24/2019.  FDG avid pulmonary nodules are increased in size and multiplicity compared from previous exams, persistent and progressive FDG uptake avid right supraclavicular mediastinal and hilar adenopathy  Social History   Tobacco Use  Smoking Status Former Smoker  . Packs/day: 1.00  . Years: 40.00  . Pack years: 40.00  . Types: Cigarettes  . Quit date: 11/14/2003  . Years since quitting: 16.2  Smokeless Tobacco Never Used   Immunization History  Administered Date(s) Administered  . Influenza, High Dose Seasonal PF 08/02/2015, 09/10/2017, 11/01/2018, 09/26/2019  . Influenza,inj,Quad PF,6+ Mos 08/07/2016  . Influenza-Unspecified 07/09/2013, 07/14/2014, 08/02/2015, 08/07/2016  . Moderna SARS-COVID-2 Vaccination 01/13/2020  . Pneumococcal Conjugate-13 01/31/2016  . Pneumococcal Polysaccharide-23 07/12/2009, 02/20/2017  . Tdap 01/02/2011, 01/02/2011, 12/09/2012, 12/09/2012  . Zoster 01/11/2012  . Zoster Recombinat (Shingrix) 01/11/2012    Assessment and Plan:  Asthma-COPD overlap syndrome (Natasha Peters) Plan: Augmentin today Continue Stiolto Respimat Continue Pulmicort nebs Continue Flonase Continue nasal saline rinses Recommend contacting oncology to see if they would coordinate an x-ray for further evaluation prior to your Friday office visit  Obstructive sleep apnea Plan: Continue CPAP therapy  [facility]den[facility]) Plan: Keep follow-up with [facility] on Friday Contact  them today reporting acute or worsening shortness of breath Notify them that I am treating you with Augmentin  Abnormal findings on diagnostic imaging of lung Plan: Continue to work with [facility] oncology  Dyspnea Patient continues to have ongoing shortness of breath.  This is likely multifactorial.  Patient is reporting increased shortness of breath positionally when laying back.  Patient's last x-ray on 01/22/2020 does not show pleural effusion.  Plan: Would recommend the patient contact oncology to see if they would like to have an x-ray prior to Friday's office visit We will treat with Augmentin today given increased mucus production and discolored mucus Would appreciate oncology's input regarding shortness of breath to see if this may be directly related to her treatments and the Keytruda Continue Pulmicort nebs twice daily Continue Stiolto Respimat Keep follow-up with our office on 03/05/2020   Follow Up Instructions:  Return in about 10 days (around 03/05/2020), or if symptoms worsen or fail to improve, for Follow up with Wyn Quaker FNP-C.    I discussed the assessment and treatment plan with the patient. The patient was provided an opportunity to ask questions and all were answered. The patient agreed with the plan and demonstrated an understanding of the instructions.   The patient was advised to call back or seek an in-person evaluation if the symptoms worsen or if the condition fails to improve as anticipated.  I provided 32 minutes of non-face-to-face time during this encounter.   Lauraine Rinne, NP

## 2020-02-24 NOTE — Assessment & Plan Note (Signed)
Patient continues to have ongoing shortness of breath.  This is likely multifactorial.  Patient is reporting increased shortness of breath positionally when laying back.  Patient's last x-ray on 01/22/2020 does not show pleural effusion.  Plan: Would recommend the patient contact oncology to see if they would like to have an x-ray prior to Friday's office visit We will treat with Augmentin today given increased mucus production and discolored mucus Would appreciate oncology's input regarding shortness of breath to see if this may be directly related to her treatments and the Keytruda Continue Pulmicort nebs twice daily Continue Stiolto Respimat Keep follow-up with our office on 03/05/2020

## 2020-02-24 NOTE — Assessment & Plan Note (Signed)
Plan: Continue to work with [facility] oncology

## 2020-02-24 NOTE — Telephone Encounter (Signed)
It looks like we tried starting her on Stiolto last time, that we needed her formulary to determine which long-acting inhaled meds to continue.  Is she still on Stiolto?  We could add it back if she benefited from it. If she has her formulary we can try to work from that information.

## 2020-02-24 NOTE — Assessment & Plan Note (Signed)
Plan: Augmentin today Continue Stiolto Respimat Continue Pulmicort nebs Continue Flonase Continue nasal saline rinses Recommend contacting oncology to see if they would coordinate an x-ray for further evaluation prior to your Friday office visit

## 2020-02-24 NOTE — Assessment & Plan Note (Signed)
Plan: Keep follow-up with [facility] oncology on Friday Contact them today reporting acute or worsening shortness of breath Notify them that I am treating you with Augmentin

## 2020-02-24 NOTE — Patient Instructions (Addendum)
You were seen today by Lauraine Rinne, NP  for:   1. Asthma-COPD overlap syndrome (HCC)  - amoxicillin-clavulanate (AUGMENTIN) 875-125 MG tablet; Take 1 tablet by mouth 2 (two) times daily.  Dispense: 14 tablet; Refill: 0  Continue Stiolto Respimat  Continue Pulmicort nebs twice daily  2. Shortness of breath  I believe there are multiple contributing factors to your shortness of breath.  I do believe that you would benefit from another x-ray.  You can discuss this with [facility] oncology.  They may like for you to get the x-ray prior to them seeing you on Friday  3. Obstructive sleep apnea  Continue CPAP  4. Adenocarcinoma of colon ([place]) 5. Abnormal findings on diagnostic imaging of lung  Keep follow-up with [facility] oncology  Notify them today of your acute worsening symptoms   We recommend today:   Meds ordered this encounter  Medications  . amoxicillin-clavulanate (AUGMENTIN) 875-125 MG tablet    Sig: Take 1 tablet by mouth 2 (two) times daily.    Dispense:  14 tablet    Refill:  0    Follow Up:    Return in about 10 days (around 03/05/2020), or if symptoms worsen or fail to improve, for Follow up with Wyn Quaker FNP-C.   Please do your part to reduce the spread of COVID-19:      Reduce your risk of any infection  and COVID19 by using the similar precautions used for avoiding the common cold or flu:  Marland Kitchen Wash your hands often with soap and warm water for at least 20 seconds.  If soap and water are not readily available, use an alcohol-based hand sanitizer with at least 60% alcohol.  . If coughing or sneezing, cover your mouth and nose by coughing or sneezing into the elbow areas of your shirt or coat, into a tissue or into your sleeve (not your hands). Langley Gauss A MASK when in public  . Avoid shaking hands with others and consider head nods or verbal greetings only. . Avoid touching your eyes, nose, or mouth with unwashed hands.  . Avoid close contact with people  who are sick. . Avoid places or events with large numbers of people in one location, like concerts or sporting events. . If you have some symptoms but not all symptoms, continue to monitor at home and seek medical attention if your symptoms worsen. . If you are having a medical emergency, call 911.   [place] / e-Visit: [email or web address]         [facility]: [facility]: [id number]                   [facility]: [id number]     It is flu season:   >>> Best ways to protect herself from the flu: Receive the yearly flu vaccine, practice good hand hygiene washing with soap and also using hand sanitizer when available, eat a nutritious meals, get adequate rest, hydrate appropriately   Please contact the office if your symptoms worsen or you have concerns that you are not improving.   Thank you for choosing [facility] for your healthcare, and for allowing [place] to partner with you on your healthcare journey. I am thankful to be able to provide care to you today.   Wyn Quaker FNP-C

## 2020-02-24 NOTE — Assessment & Plan Note (Signed)
Plan: Continue CPAP therapy 

## 2020-03-05 ENCOUNTER — Ambulatory Visit: Payer: Medicare HMO | Admitting: Pulmonary Disease

## 2020-03-13 DEATH — deceased

## 2020-06-08 IMAGING — DX DG CHEST 2V
2 series · 2 of 2 positions shown · non-contrast
Comparison: Chest x-ray 11/01/2017 and chest CT 10/26/2017

CLINICAL DATA: Worsening shortness of breath. History of lung
cancer.

EXAM:
CHEST - 2 VIEW

[chest pa]
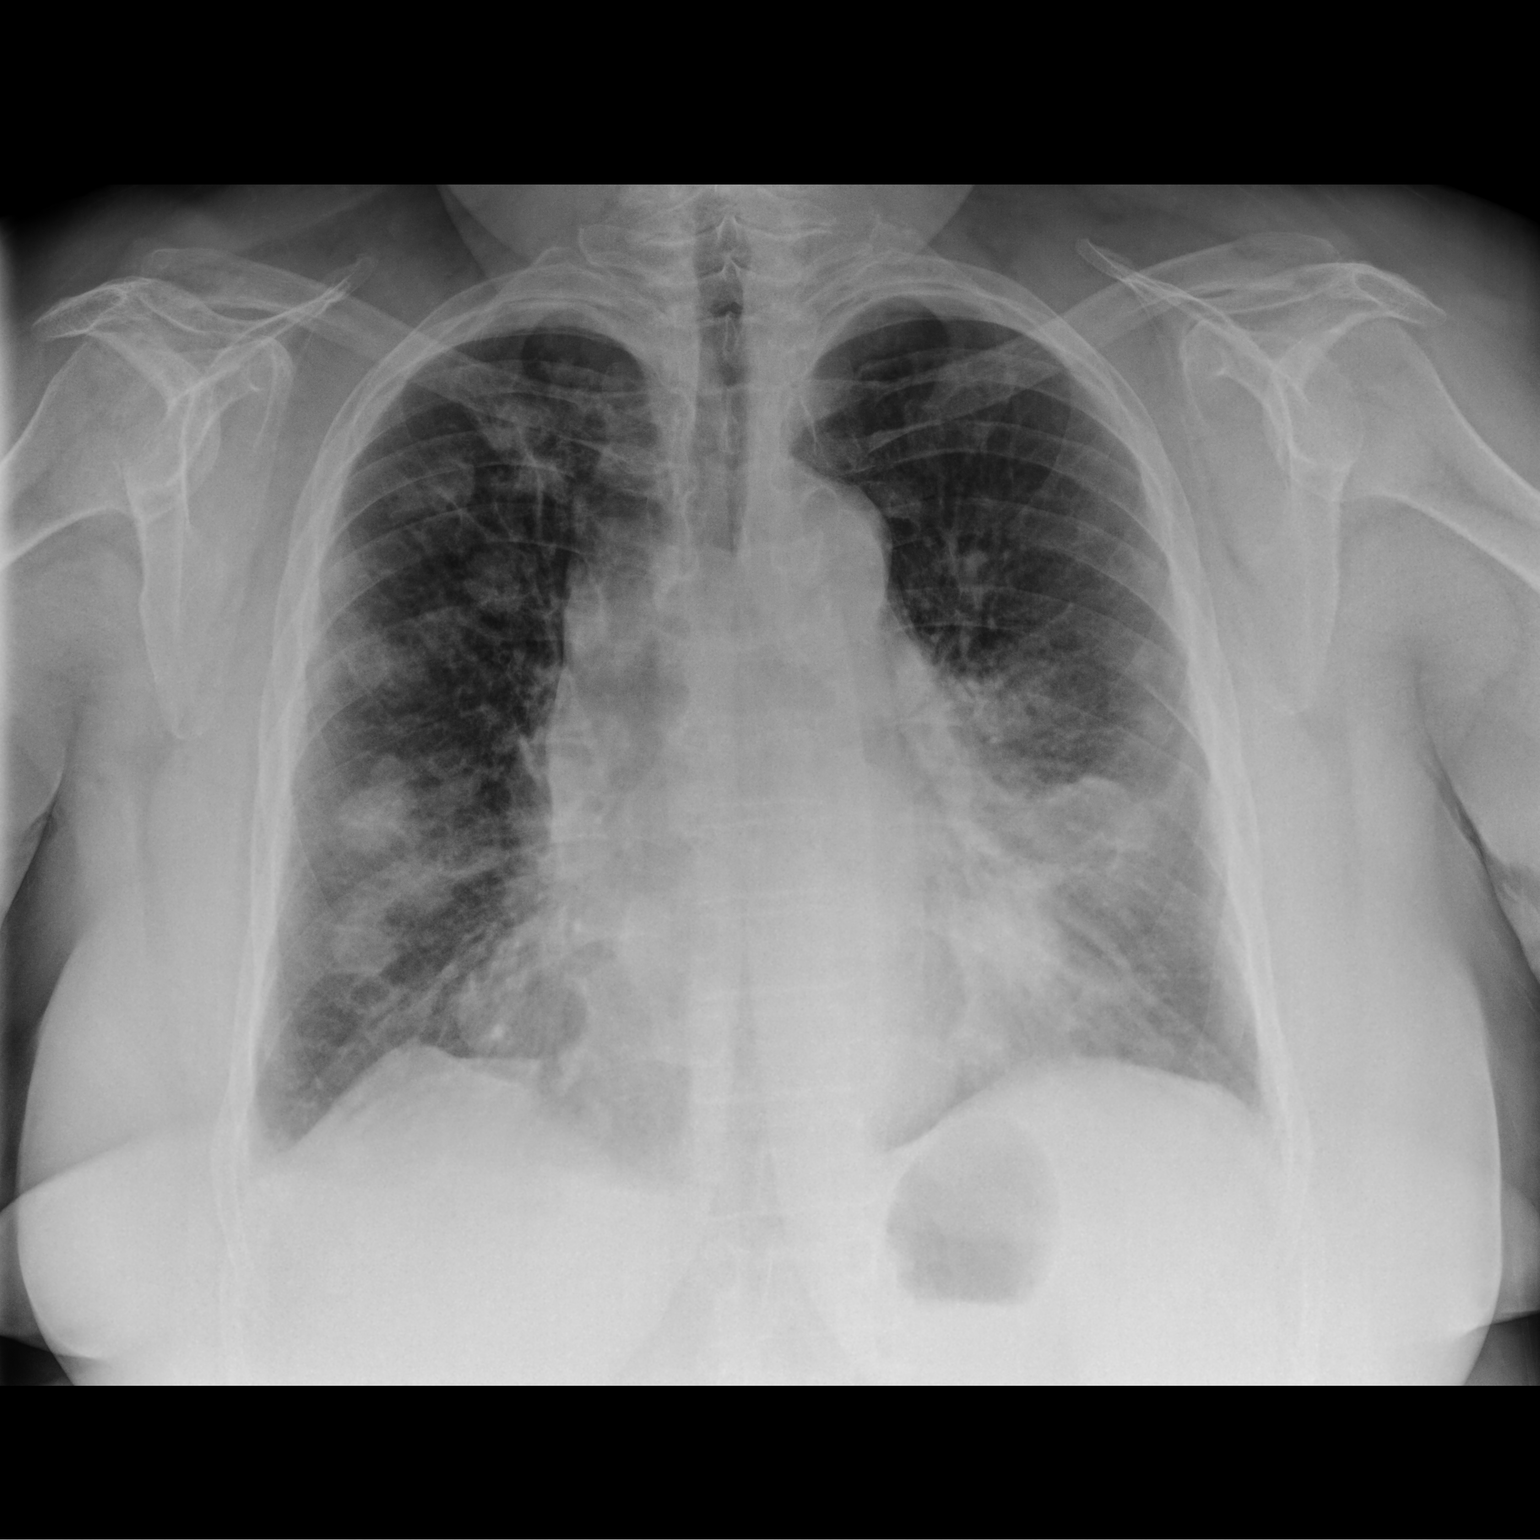

[chest lat]
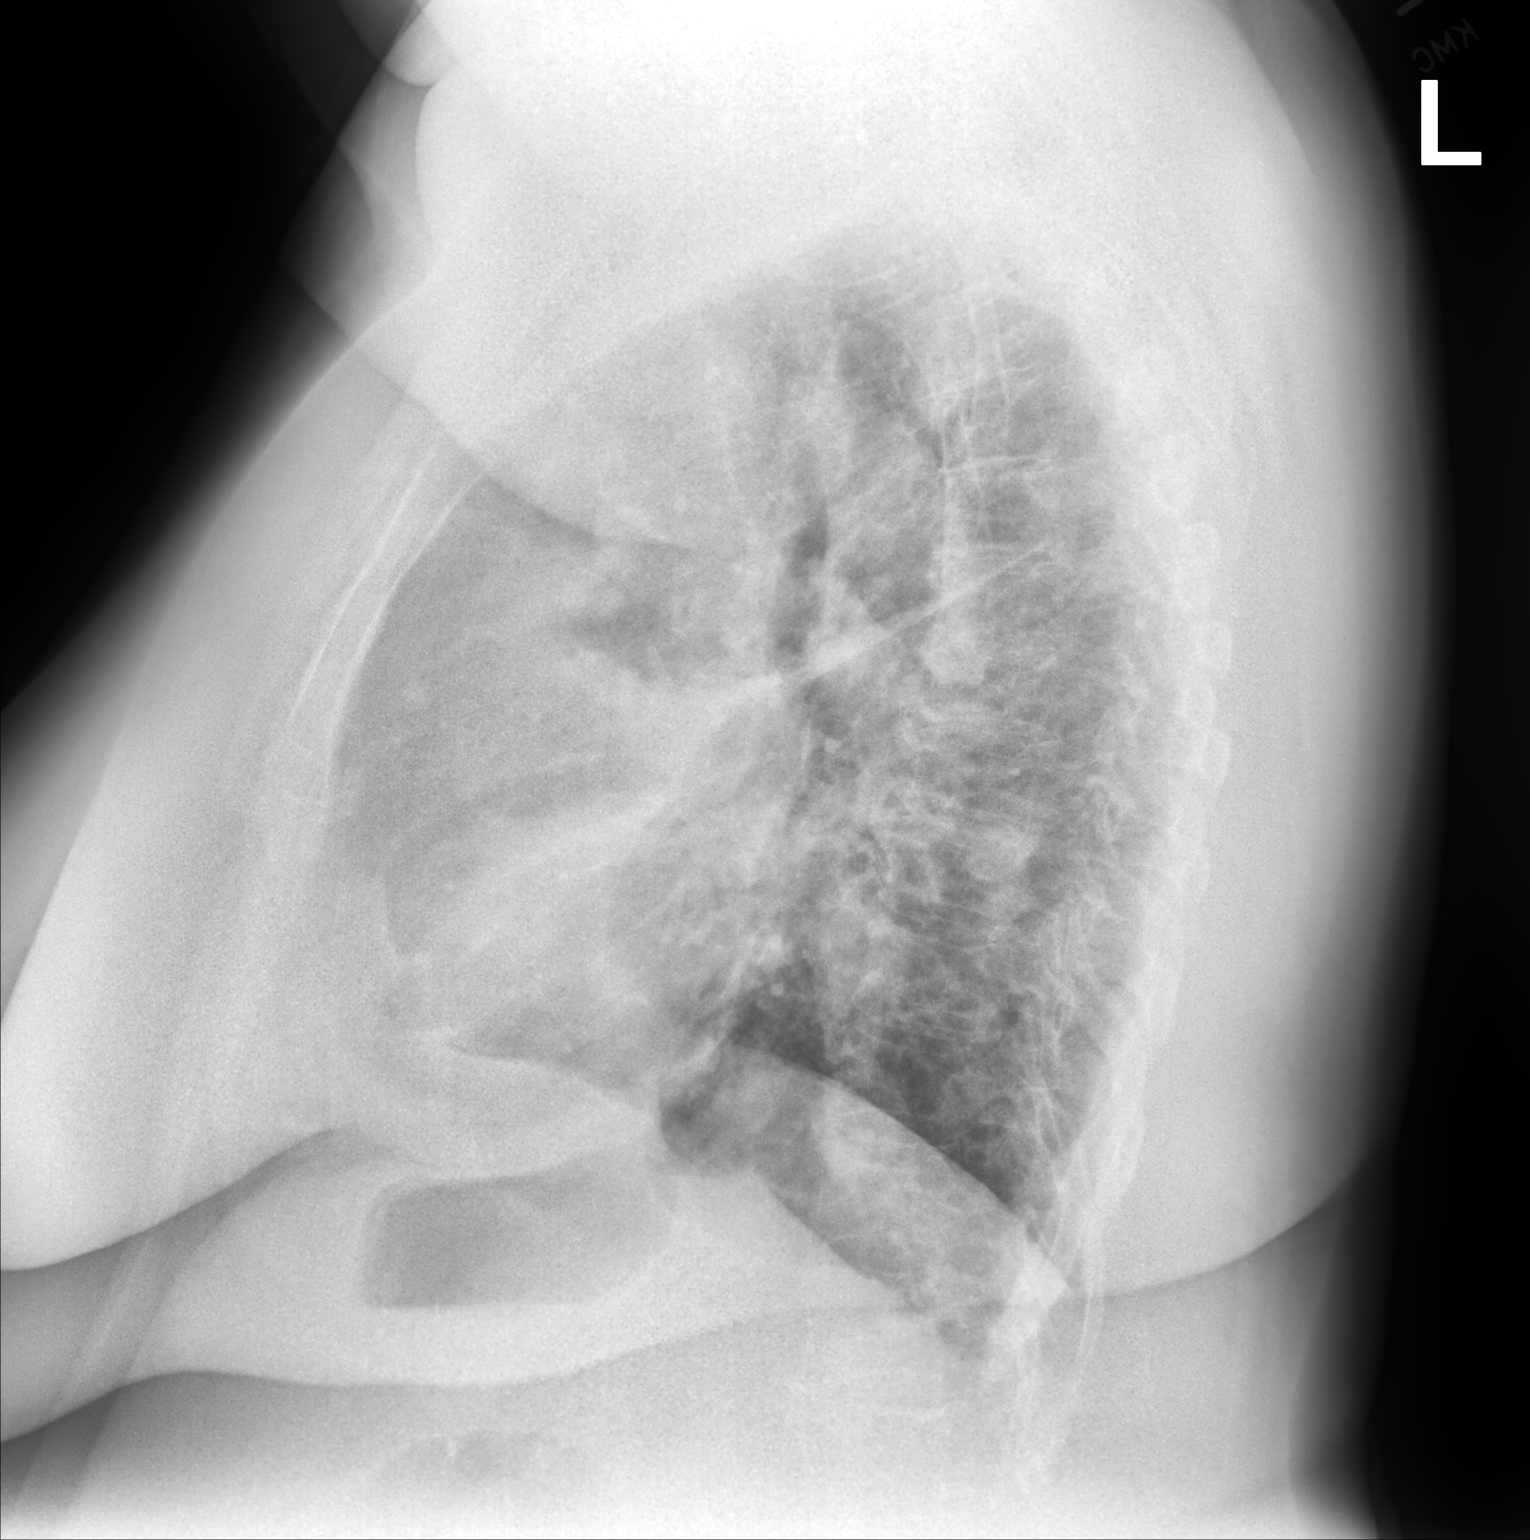

[2 of 2 positions shown; findings below may reference images not displayed]

FINDINGS: The heart is normal in size. Mediastinal and hilar contours
suspicious for adenopathy.

Numerous bilateral pulmonary lesions worrisome for metastatic
disease. No pleural effusions. No obvious bone lesions. Recommend CT
chest, abdomen/pelvis with contrast for further evaluation.
IMPRESSION: 1. Numerous bilateral pulmonary lesions worrisome for metastatic
disease.
2. Suspect mediastinal and hilar adenopathy.
3. Recommend CT chest, abdomen and pelvis with contrast for further
evaluation.

These results will be called to the ordering clinician or
representative by the Radiologist Assistant, and communication
documented in the PACS or [REDACTED].

## 2020-08-12 NOTE — Assessment & Plan Note (Signed)
Continue to wear your CPAP every night reliably

## 2020-08-12 NOTE — Assessment & Plan Note (Signed)
We will temporarily stop Anoro.  Start Stiolto 2 puffs once daily. Try to obtain a copy of your insurance inhaler formulary so that we can review at next time and decide which medication to continue going forward Keep albuterol available use 2 puffs if needed for shortness of breath, chest tightness, wheezing.  You can use this up to every 4 hours depending on how your breathing is doing. Agree with prednisone as ordered.  Please complete your course of 60 mg daily until completely gone Get your repeat chest imaging as planned by oncology at [facility] with Levaquin as ordered.  Please complete the full 10-day course Follow with Dr Lamonte Sakai in 1 month
